# Patient Record
Sex: Female | Born: 1975
Health system: Southern US, Community
[De-identification: ages and names within clinical notes are randomized; demographics above are authoritative.]

## PROBLEM LIST (undated history)

## (undated) DIAGNOSIS — D689 Coagulation defect, unspecified: Secondary | ICD-10-CM

## (undated) DIAGNOSIS — I82409 Acute embolism and thrombosis of unspecified deep veins of unspecified lower extremity: Secondary | ICD-10-CM

## (undated) HISTORY — PX: OTHER SURGICAL HISTORY: SHX169

## (undated) HISTORY — DX: Coagulation defect, unspecified: D68.9

---

## 2011-10-31 ENCOUNTER — Encounter (HOSPITAL_BASED_OUTPATIENT_CLINIC_OR_DEPARTMENT_OTHER): Payer: Self-pay

## 2011-10-31 ENCOUNTER — Emergency Department (HOSPITAL_BASED_OUTPATIENT_CLINIC_OR_DEPARTMENT_OTHER)
Admission: EM | Admit: 2011-10-31 | Discharge: 2011-10-31 | Disposition: A | Discharge status: Home / Self Care | Payer: Self-pay | Attending: Emergency Medicine | Admitting: Emergency Medicine

## 2011-10-31 DIAGNOSIS — Z86718 Personal history of other venous thrombosis and embolism: Secondary | ICD-10-CM | POA: Insufficient documentation

## 2011-10-31 DIAGNOSIS — R42 Dizziness and giddiness: Secondary | ICD-10-CM | POA: Insufficient documentation

## 2011-10-31 HISTORY — DX: Acute embolism and thrombosis of unspecified deep veins of unspecified lower extremity: I82.409

## 2011-10-31 MED ORDER — MECLIZINE HCL 50 MG PO TABS: 25.0000 mg | ORAL_TABLET | Freq: Three times a day (TID) | ORAL | Status: AC | PRN

## 2011-10-31 MED ORDER — MECLIZINE HCL 25 MG PO TABS
25.0000 mg | ORAL_TABLET | Freq: Once | ORAL | Status: AC
Start: 2011-10-31 — End: 2011-10-31
  Administered 2011-10-31: 25 mg via ORAL
  Filled 2011-10-31: qty 1

## 2011-10-31 NOTE — ED Provider Notes (Signed)
History   This chart was scribed for Rolan Bucco, MD by Sofie Rower. The patient was seen in room MH07/MH07 and the patient's care was started at 5:49 PM     CSN: [id number]  Arrival date & time 10/31/11  1724   First MD Initiated Contact with Patient 10/31/11 1743      Chief Complaint  Patient presents with  . Dizziness  . Ear Fullness    (Consider location/radiation/quality/duration/timing/severity/associated sxs/prior treatment) HPI  Angela Shah is a 36 y.o. female who presents to the Emergency Department complaining of moderate, constant dizziness onset today. The pt states "there is a swooshing sound in her ears." The pt informs the EDP that she "got up this morning, jumped out of bed fast, and felt like the whole room was spinning." Still feels dizzy when she sits up or turns her head.  No headache, no numbness or weakness to extremities.  No speech problems.  Has hx of similar symptoms once in past, but it went away on its own.   Modifying factors include standing up, sitting up, and moving head side to side which intensifies the dizziness. Pt has a hx of DVT, was on coumadin, has been off for about one years.  Was attributed to train ride that she took.  No hx of clotting disorders that she knows of.   Pt denies chest pain, fever, congestion, difficulty speaking, swollowing, difficulty urinating, missed periods, recent head injuries.   Pt does not have a PCP.     Past Medical History  Diagnosis Date  . DVT (deep venous thrombosis)      History  Substance Use Topics  . Smoking status: Never Smoker   . Smokeless tobacco: Never Used  . Alcohol Use: No    OB History    Grav Para Term Preterm Abortions TAB SAB Ect Mult Living                  Review of Systems  Constitutional: Negative for fever, chills, diaphoresis and fatigue.  HENT: Negative for congestion, rhinorrhea and sneezing.   Eyes: Negative.   Respiratory: Negative for cough, chest tightness and  shortness of breath.   Cardiovascular: Negative for chest pain and leg swelling.  Gastrointestinal: Negative for nausea, vomiting, abdominal pain, diarrhea and blood in stool.  Genitourinary: Negative for frequency, hematuria, flank pain and difficulty urinating.  Musculoskeletal: Negative for back pain and arthralgias.  Skin: Negative for rash.  Neurological: Positive for dizziness. Negative for speech difficulty, weakness, numbness and headaches.    Allergies  Review of patient's allergies indicates no known allergies.  Home Medications   Current Outpatient Rx  Name Route Sig Dispense Refill  . IBUPROFEN 200 MG PO TABS Oral Take 400 mg by mouth every 6 (six) hours as needed. Patient used this medication for her headache.    Marland Kitchen MECLIZINE HCL 50 MG PO TABS Oral Take 0.5 tablets (25 mg total) by mouth 3 (three) times daily as needed. 30 tablet 0    BP 120/83  Pulse 82  Temp 98.1 F (36.7 C) (Oral)  Resp 16  Ht 5\' 4"  (1.626 m)  Wt 156 lb (70.761 kg)  BMI 26.78 kg/m2  SpO2 100%  LMP 09/30/2011  Physical Exam  Nursing note and vitals reviewed. Constitutional: She is oriented to person, place, and time. She appears well-developed and well-nourished.  HENT:  Head: Normocephalic and atraumatic.  Right Ear: External ear normal.  Left Ear: External ear normal.  Nose: Nose normal.  Mouth/Throat: Oropharynx is clear and moist.  Eyes: Pupils are equal, round, and reactive to light. Right eye exhibits no nystagmus. Left eye exhibits no nystagmus.  Neck: Normal range of motion. Neck supple.  Cardiovascular: Normal rate, regular rhythm and normal heart sounds.   Pulmonary/Chest: Effort normal and breath sounds normal. No respiratory distress. She has no wheezes. She has no rales. She exhibits no tenderness.  Abdominal: Soft. Bowel sounds are normal. There is no tenderness. There is no rebound and no guarding.  Musculoskeletal: Normal range of motion. She exhibits no edema.    Lymphadenopathy:    She has no cervical adenopathy.  Neurological: She is alert and oriented to person, place, and time. She has normal strength. No cranial nerve deficit or sensory deficit. Coordination normal. GCS eye subscore is 4. GCS verbal subscore is 5. GCS motor subscore is 6.       Finger to nose intact.   Skin: Skin is warm and dry. No rash noted.  Psychiatric: She has a normal mood and affect.    ED Course  Procedures (including critical care time)  DIAGNOSTIC STUDIES: Oxygen Saturation is 100% on room air, normal by my interpretation.    COORDINATION OF CARE:  5:56PM- EDP at bedside discusses treatment plan concerning vertigo.     Labs Reviewed - No data to display No results found.   1. Vertigo       MDM  Pt feeling much better after meclizine, ambulates without problem.  No headache or neuro deficits to suggest CVA/ICH.  Will tx for vertigo, encouraged f/u with PMD if symptoms not improving.  Advised to return here for any worsening symtpoms or neuro changes      I personally performed the services described in this documentation, which was scribed in my presence.  The recorded information has been reviewed and considered.    Rolan Bucco, MD 10/31/11 2014

## 2011-10-31 NOTE — ED Notes (Signed)
Pt reports dizziness and a "swooshing" sound in right ear.

## 2011-10-31 NOTE — Discharge Instructions (Signed)
Vertigo Vertigo means you feel like you or your surroundings are moving when they are not. Vertigo can be dangerous if it occurs when you are at work, driving, or performing difficult activities.  CAUSES  Vertigo occurs when there is a conflict of signals sent to your brain from the visual and sensory systems in your body. There are many different causes of vertigo, including:  Infections, especially in the inner ear.   A bad reaction to a drug or misuse of alcohol and medicines.   Withdrawal from drugs or alcohol.   Rapidly changing positions, such as lying down or rolling over in bed.   A migraine headache.   Decreased blood flow to the brain.   Increased pressure in the brain from a head injury, infection, tumor, or bleeding.  SYMPTOMS  You may feel as though the world is spinning around or you are falling to the ground. Because your balance is upset, vertigo can cause nausea and vomiting. You may have involuntary eye movements (nystagmus). DIAGNOSIS  Vertigo is usually diagnosed by physical exam. If the cause of your vertigo is unknown, your caregiver may perform imaging tests, such as an MRI scan (magnetic resonance imaging). TREATMENT  Most cases of vertigo resolve on their own, without treatment. Depending on the cause, your caregiver may prescribe certain medicines. If your vertigo is related to body position issues, your caregiver may recommend movements or procedures to correct the problem. In rare cases, if your vertigo is caused by certain inner ear problems, you may need surgery. HOME CARE INSTRUCTIONS   Follow your caregiver's instructions.   Avoid driving.   Avoid operating heavy machinery.   Avoid performing any tasks that would be dangerous to you or others during a vertigo episode.   Tell your caregiver if you notice that certain medicines seem to be causing your vertigo. Some of the medicines used to treat vertigo episodes can actually make them worse in some  people.  SEEK IMMEDIATE MEDICAL CARE IF:   Your medicines do not relieve your vertigo or are making it worse.   You develop problems with talking, walking, weakness, or using your arms, hands, or legs.   You develop severe headaches.   Your nausea or vomiting continues or gets worse.   You develop visual changes.   A family member notices behavioral changes.   Your condition gets worse.  MAKE SURE YOU:  Understand these instructions.   Will watch your condition.   Will get help right away if you are not doing well or get worse.  Document Released: 02/09/2005 Document Revised: 04/21/2011 Document Reviewed: 11/18/2010 Exit[employer] Patient Information 2012 [employer]. 

## 2013-09-12 ENCOUNTER — Emergency Department (HOSPITAL_BASED_OUTPATIENT_CLINIC_OR_DEPARTMENT_OTHER)
Admission: EM | Admit: 2013-09-12 | Discharge: 2013-09-12 | Disposition: A | Discharge status: Home / Self Care | Payer: Medicaid Other | Attending: Emergency Medicine | Admitting: Emergency Medicine

## 2013-09-12 ENCOUNTER — Encounter (HOSPITAL_BASED_OUTPATIENT_CLINIC_OR_DEPARTMENT_OTHER): Payer: Self-pay | Admitting: Emergency Medicine

## 2013-09-12 DIAGNOSIS — J329 Chronic sinusitis, unspecified: Secondary | ICD-10-CM

## 2013-09-12 DIAGNOSIS — J069 Acute upper respiratory infection, unspecified: Secondary | ICD-10-CM

## 2013-09-12 DIAGNOSIS — Z86718 Personal history of other venous thrombosis and embolism: Secondary | ICD-10-CM | POA: Insufficient documentation

## 2013-09-12 DIAGNOSIS — H9209 Otalgia, unspecified ear: Secondary | ICD-10-CM | POA: Insufficient documentation

## 2013-09-12 LAB — RAPID STREP SCREEN (MED CTR MEBANE ONLY): Streptococcus, Group A Screen (Direct): NEGATIVE

## 2013-09-12 MED ORDER — FLUTICASONE PROPIONATE 50 MCG/ACT NA SUSP: 2.0000 | Freq: Every day | NASAL | Status: DC

## 2013-09-12 NOTE — Discharge Instructions (Signed)
Upper Respiratory Infection, Adult An upper respiratory infection (URI) is also sometimes known as the common cold. The upper respiratory tract includes the nose, sinuses, throat, trachea, and bronchi. Bronchi are the airways leading to the lungs. Most people improve within 1 week, but symptoms can last up to 2 weeks. A residual cough may last even longer.  CAUSES Many different viruses can infect the tissues lining the upper respiratory tract. The tissues become irritated and inflamed and often become very moist. Mucus production is also common. A cold is contagious. You can easily spread the virus to others by oral contact. This includes kissing, sharing a glass, coughing, or sneezing. Touching your mouth or nose and then touching a surface, which is then touched by another person, can also spread the virus. SYMPTOMS  Symptoms typically develop 1 to 3 days after you come in contact with a cold virus. Symptoms vary from person to person. They may include:  Runny nose.  Sneezing.  Nasal congestion.  Sinus irritation.  Sore throat.  Loss of voice (laryngitis).  Cough.  Fatigue.  Muscle aches.  Loss of appetite.  Headache.  Low-grade fever. DIAGNOSIS  You might diagnose your own cold based on familiar symptoms, since most people get a cold 2 to 3 times a year. Your caregiver can confirm this based on your exam. Most importantly, your caregiver can check that your symptoms are not due to another disease such as strep throat, sinusitis, pneumonia, asthma, or epiglottitis. Blood tests, throat tests, and X-rays are not necessary to diagnose a common cold, but they may sometimes be helpful in excluding other more serious diseases. Your caregiver will decide if any further tests are required. RISKS AND COMPLICATIONS  You may be at risk for a more severe case of the common cold if you smoke cigarettes, have chronic heart disease (such as heart failure) or lung disease (such as asthma), or if  you have a weakened immune system. The very young and very old are also at risk for more serious infections. Bacterial sinusitis, middle ear infections, and bacterial pneumonia can complicate the common cold. The common cold can worsen asthma and chronic obstructive pulmonary disease (COPD). Sometimes, these complications can require emergency medical care and may be life-threatening. PREVENTION  The best way to protect against getting a cold is to practice good hygiene. Avoid oral or hand contact with people with cold symptoms. Wash your hands often if contact occurs. There is no clear evidence that vitamin C, vitamin E, echinacea, or exercise reduces the chance of developing a cold. However, it is always recommended to get plenty of rest and practice good nutrition. TREATMENT  Treatment is directed at relieving symptoms. There is no cure. Antibiotics are not effective, because the infection is caused by a virus, not by bacteria. Treatment may include:  Increased fluid intake. Sports drinks offer valuable electrolytes, sugars, and fluids.  Breathing heated mist or steam (vaporizer or shower).  Eating chicken soup or other clear broths, and maintaining good nutrition.  Getting plenty of rest.  Using gargles or lozenges for comfort.  Controlling fevers with ibuprofen or acetaminophen as directed by your caregiver.  Increasing usage of your inhaler if you have asthma. Zinc gel and zinc lozenges, taken in the first 24 hours of the common cold, can shorten the duration and lessen the severity of symptoms. Pain medicines may help with fever, muscle aches, and throat pain. A variety of non-prescription medicines are available to treat congestion and runny nose. Your caregiver  duration and lessen the severity of symptoms. Pain medicines may help with fever, muscle aches, and throat pain. A variety of non-prescription medicines are available to treat congestion and runny nose. Your caregiver can make recommendations and may suggest nasal or lung inhalers for other symptoms.   HOME CARE INSTRUCTIONS   · Only take over-the-counter or prescription medicines for pain, discomfort, or fever as directed by your  caregiver.  · Use a warm mist humidifier or inhale steam from a shower to increase air moisture. This may keep secretions moist and make it easier to breathe.  · Drink enough water and fluids to keep your urine clear or pale yellow.  · Rest as needed.  · Return to work when your temperature has returned to normal or as your caregiver advises. You may need to stay home longer to avoid infecting others. You can also use a face mask and careful hand washing to prevent spread of the virus.  SEEK MEDICAL CARE IF:   · After the first few days, you feel you are getting worse rather than better.  · You need your caregiver's advice about medicines to control symptoms.  · You develop chills, worsening shortness of breath, or brown or red sputum. These may be signs of pneumonia.  · You develop yellow or brown nasal discharge or pain in the face, especially when you bend forward. These may be signs of sinusitis.  · You develop a fever, swollen neck glands, pain with swallowing, or white areas in the back of your throat. These may be signs of strep throat.  SEEK IMMEDIATE MEDICAL CARE IF:   · You have a fever.  · You develop severe or persistent headache, ear pain, sinus pain, or chest pain.  · You develop wheezing, a prolonged cough, cough up blood, or have a change in your usual mucus (if you have chronic lung disease).  · You develop sore muscles or a stiff neck.  Document Released: 10/26/2000 Document Revised: 07/25/2011 Document Reviewed: 09/03/2010  [employer]® Patient Information ©2014 [employer].  Sinusitis  Sinusitis is redness, soreness, and swelling (inflammation) of the paranasal sinuses. Paranasal sinuses are air pockets within the bones of your face (beneath the eyes, the middle of the forehead, or above the eyes). In healthy paranasal sinuses, mucus is able to drain out, and air is able to circulate through them by way of your nose. However, when your paranasal sinuses are inflamed, mucus and air can become  trapped. This can allow bacteria and other germs to grow and cause infection.  Sinusitis can develop quickly and last only a short time (acute) or continue over a long period (chronic). Sinusitis that lasts for more than 12 weeks is considered chronic.   CAUSES   Causes of sinusitis include:  · Allergies.  · Structural abnormalities, such as displacement of the cartilage that separates your nostrils (deviated septum), which can decrease the air flow through your nose and sinuses and affect sinus drainage.  · Functional abnormalities, such as when the small hairs (cilia) that line your sinuses and help remove mucus do not work properly or are not present.  SYMPTOMS   Symptoms of acute and chronic sinusitis are the same. The primary symptoms are pain and pressure around the affected sinuses. Other symptoms include:  · Upper toothache.  · Earache.  · Headache.  · Bad breath.  · Decreased sense of smell and taste.  · A cough, which worsens when you are lying flat.  · Fatigue.  ·   Fever.  · Thick drainage from your nose, which often is green and may contain pus (purulent).  · Swelling and warmth over the affected sinuses.  DIAGNOSIS   Your caregiver will perform a physical exam. During the exam, your caregiver may:  · Look in your nose for signs of abnormal growths in your nostrils (nasal polyps).  · Tap over the affected sinus to check for signs of infection.  · View the inside of your sinuses (endoscopy) with a special imaging device with a light attached (endoscope), which is inserted into your sinuses.  If your caregiver suspects that you have chronic sinusitis, one or more of the following tests may be recommended:  · Allergy tests.  · Nasal culture A sample of mucus is taken from your nose and sent to a lab and screened for bacteria.  · Nasal cytology A sample of mucus is taken from your nose and examined by your caregiver to determine if your sinusitis is related to an allergy.  TREATMENT   Most cases of acute  sinusitis are related to a viral infection and will resolve on their own within 10 days. Sometimes medicines are prescribed to help relieve symptoms (pain medicine, decongestants, nasal steroid sprays, or saline sprays).   However, for sinusitis related to a bacterial infection, your caregiver will prescribe antibiotic medicines. These are medicines that will help kill the bacteria causing the infection.   Rarely, sinusitis is caused by a fungal infection. In theses cases, your caregiver will prescribe antifungal medicine.  For some cases of chronic sinusitis, surgery is needed. Generally, these are cases in which sinusitis recurs more than 3 times per year, despite other treatments.  HOME CARE INSTRUCTIONS   · Drink plenty of water. Water helps thin the mucus so your sinuses can drain more easily.  · Use a humidifier.  · Inhale steam 3 to 4 times a day (for example, sit in the bathroom with the shower running).  · Apply a warm, moist washcloth to your face 3 to 4 times a day, or as directed by your caregiver.  · Use saline nasal sprays to help moisten and clean your sinuses.  · Take over-the-counter or prescription medicines for pain, discomfort, or fever only as directed by your caregiver.  SEEK IMMEDIATE MEDICAL CARE IF:  · You have increasing pain or severe headaches.  · You have nausea, vomiting, or drowsiness.  · You have swelling around your face.  · You have vision problems.  · You have a stiff neck.  · You have difficulty breathing.  MAKE SURE YOU:   · Understand these instructions.  · Will watch your condition.  · Will get help right away if you are not doing well or get worse.  Document Released: 05/02/2005 Document Revised: 07/25/2011 Document Reviewed: 05/17/2011  [employer]® Patient Information ©2014 [employer].

## 2013-09-12 NOTE — ED Provider Notes (Signed)
TIME SEEN: 12:15 PM  CHIEF COMPLAINT: Cough, sore throat, ear pain  HPI: Patient is a 38 y.o. F with history of prior DVT who presents emergency department with one day of bilateral ear pain, sore throat, productive cough with yellow secretion, rhinorrhea and nasal congestion. She states she's also had low-grade fever. No chest pain or shortness of breath. No vomiting or diarrhea. No headache, neck pain or neck stiffness. No sick contacts or recent travel.  ROS: See HPI Constitutional: Subjective fever  Eyes: no drainage  ENT: runny nose   Cardiovascular:  no chest pain  Resp: no SOB  GI: no vomiting GU: no dysuria Integumentary: no rash  Allergy: no hives  Musculoskeletal: no leg swelling  Neurological: no slurred speech ROS otherwise negative  PAST MEDICAL HISTORY/PAST SURGICAL HISTORY:  Past Medical History  Diagnosis Date  . DVT (deep venous thrombosis)     MEDICATIONS:  Prior to Admission medications   Medication Sig Start Date End Date Taking? Authorizing Provider  ibuprofen (ADVIL,MOTRIN) 200 MG tablet Take 400 mg by mouth every 6 (six) hours as needed. Patient used this medication for her headache.    Historical Provider, MD    ALLERGIES:  No Known Allergies  SOCIAL HISTORY:  History  Substance Use Topics  . Smoking status: Never Smoker   . Smokeless tobacco: Never Used  . Alcohol Use: No    FAMILY HISTORY: No family history on file.  EXAM: BP 132/81  Pulse 82  Temp(Src) 99.5 F (37.5 C) (Oral)  Resp 16  Ht 5\' 4"  (1.626 m)  Wt 148 lb (67.132 kg)  BMI 25.39 kg/m2  SpO2 100%  LMP 09/05/2013 CONSTITUTIONAL: Alert and oriented and responds appropriately to questions. Well-appearing; well-nourished HEAD: Normocephalic EYES: Conjunctivae clear, PERRL ENT: normal nose; no rhinorrhea; moist mucous membranes; patient has clear to yellow sinus drainage is in her posterior oropharynx, no tonsillar hypertrophy or exudate, no uvular deviation, no trismus or  drooling, no muffled voice, TMs are clear bilaterally, mild tenderness to palpation over her maxillary sinus bilaterally NECK: Supple, no meningismus, no LAD  CARD: RRR; S1 and S2 appreciated; no murmurs, no clicks, no rubs, no gallops RESP: Normal chest excursion without splinting or tachypnea; breath sounds clear and equal bilaterally; no wheezes, no rhonchi, no rales,  ABD/GI: Normal bowel sounds; non-distended; soft, non-tender, no rebound, no guarding BACK:  The back appears normal and is non-tender to palpation, there is no CVA tenderness EXT: Normal ROM in all joints; non-tender to palpation; no edema; normal capillary refill; no cyanosis    SKIN: Normal color for age and race; warm NEURO: Moves all extremities equally PSYCH: The patient's mood and manner are appropriate. Grooming and personal hygiene are appropriate.  MEDICAL DECISION MAKING: Patient here with likely viral symptoms. I do not feel she needs antibiotics at this time. Her strep test is negative. Her lungs are clear and she is not hypoxic and is only complaining of a minimal amount of cough. I do not think she has community-acquired pneumonia. We'll discharge him with a prescription for Flonase and work note. Have discussed return precautions and supportive care instructions. Patient verbalizes understanding and is comfortable with this plan.       Blanch[place], DO 09/12/13 1240

## 2013-09-12 NOTE — ED Notes (Signed)
Patient states she developed a bilateral ear ache last night with a sore throat and a productive cough with yellow secretions.

## 2013-09-14 LAB — CULTURE, GROUP A STREP

## 2014-01-11 ENCOUNTER — Encounter (HOSPITAL_BASED_OUTPATIENT_CLINIC_OR_DEPARTMENT_OTHER): Payer: Self-pay | Admitting: Emergency Medicine

## 2014-01-11 ENCOUNTER — Emergency Department (HOSPITAL_BASED_OUTPATIENT_CLINIC_OR_DEPARTMENT_OTHER): Payer: Medicaid Other

## 2014-01-11 ENCOUNTER — Emergency Department (HOSPITAL_BASED_OUTPATIENT_CLINIC_OR_DEPARTMENT_OTHER)
Admission: EM | Admit: 2014-01-11 | Discharge: 2014-01-11 | Disposition: A | Discharge status: Home / Self Care | Payer: Medicaid Other | Attending: Emergency Medicine | Admitting: Emergency Medicine

## 2014-01-11 DIAGNOSIS — IMO0002 Reserved for concepts with insufficient information to code with codable children: Secondary | ICD-10-CM | POA: Diagnosis not present

## 2014-01-11 DIAGNOSIS — R079 Chest pain, unspecified: Secondary | ICD-10-CM | POA: Diagnosis present

## 2014-01-11 DIAGNOSIS — R071 Chest pain on breathing: Secondary | ICD-10-CM | POA: Insufficient documentation

## 2014-01-11 DIAGNOSIS — R0789 Other chest pain: Secondary | ICD-10-CM

## 2014-01-11 DIAGNOSIS — Z86718 Personal history of other venous thrombosis and embolism: Secondary | ICD-10-CM | POA: Insufficient documentation

## 2014-01-11 MED ORDER — IBUPROFEN 800 MG PO TABS: 800.0000 mg | ORAL_TABLET | Freq: Three times a day (TID) | ORAL | Status: DC

## 2014-01-11 MED ORDER — TRAMADOL HCL 50 MG PO TABS: 50.0000 mg | ORAL_TABLET | Freq: Four times a day (QID) | ORAL | Status: DC | PRN

## 2014-01-11 NOTE — ED Notes (Addendum)
Pt states woke up this am with right side pain.  Pain worse with movement and especially extension of right side muscles (raising arm over head, standing up).  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

## 2014-01-11 NOTE — ED Provider Notes (Signed)
CSN: [id number]     Arrival date & time 01/11/14  1316 History  This chart was scribed for Angela Shah, * by Randa Evens, ED Scribe. This patient was seen in room MH04/MH04 and the patient's care was started at 3:22 PM.     Chief Complaint  Patient presents with  . rib pain    The history is provided by the patient. No language interpreter was used.   HPI Comments: Angela Shah is a 38 y.o. female who presents to the Emergency Department complaining of right sided rib pain onset today. She states that the pain worsens with movement and deep breathing. She states that moving twisting, bending and moving her arm makes her pain worse also. She denies any injury to the area. She denies rash or abdominal pain.   Past Medical History  Diagnosis Date  . DVT (deep venous thrombosis)    Past Surgical History  Procedure Laterality Date  . Tubal ligation     No family history on file. History  Substance Use Topics  . Smoking status: Never Smoker   . Smokeless tobacco: Never Used  . Alcohol Use: No   OB History   Grav Para Term Preterm Abortions TAB SAB Ect Mult Living                 Review of Systems  Gastrointestinal: Negative for abdominal pain.  Musculoskeletal: Positive for arthralgias.  Skin: Negative for rash.  All other systems reviewed and are negative.   Allergies  Review of patient's allergies indicates no known allergies.  Home Medications   Prior to Admission medications   Medication Sig Start Date End Date Taking? Authorizing Provider  fluticasone (FLONASE) 50 MCG/ACT nasal spray Place 2 sprays into both nostrils daily. 09/12/13   Kristen N Ward, DO   Triage Vitals: BP 117/70  Pulse 74  Temp(Src) 98.1 F (36.7 C) (Oral)  Resp 18  Wt 148 lb (67.132 kg)  SpO2 100%  LMP 01/09/2014  Physical Exam  Constitutional: She is oriented to person, place, and time. She appears well-developed and well-nourished. No distress.  HENT:  Head:  Normocephalic and atraumatic.  Right Ear: Hearing normal.  Left Ear: Hearing normal.  Nose: Nose normal.  Mouth/Throat: Oropharynx is clear and moist and mucous membranes are normal.  Eyes: Conjunctivae and EOM are normal. Pupils are equal, round, and reactive to light.  Neck: Normal range of motion. Neck supple.  Cardiovascular: Regular rhythm, S1 normal and S2 normal.  Exam reveals no gallop and no friction rub.   No murmur heard. Pulmonary/Chest: Effort normal and breath sounds normal. No respiratory distress. She exhibits no tenderness.  Abdominal: Soft. Normal appearance and bowel sounds are normal. There is no hepatosplenomegaly. There is no tenderness. There is no rebound, no guarding, no tenderness at McBurney's point and negative Murphy's sign. No hernia.  Musculoskeletal: Normal range of motion. She exhibits tenderness.  Tenderness around right lateral ribs no crepitus   Neurological: She is alert and oriented to person, place, and time. She has normal strength. No cranial nerve deficit or sensory deficit. Coordination normal. GCS eye subscore is 4. GCS verbal subscore is 5. GCS motor subscore is 6.  Skin: Skin is warm, dry and intact. No rash noted. No cyanosis.  Psychiatric: She has a normal mood and affect. Her speech is normal and behavior is normal. Thought content normal.    ED Course  Procedures (including critical care time) DIAGNOSTIC STUDIES: Oxygen Saturation is 100% on RA,  normal by my interpretation.    COORDINATION OF CARE: 3:26 PM-Discussed treatment plan which includes X-ray of Ribs on right side with pt at bedside and pt agreed to plan.     Labs Review Labs Reviewed - No data to display  Imaging Review No results found.   EKG Interpretation None      MDM   Final diagnoses:  None   Presents to ER for evaluation of right posterior and lateral rib discomfort since waking this morning. Area is tender to touch without crepitance. No overlying skin  changes, no cellulitis or vesicular rash. Patient's lungs are clear. Chest x-ray with ribs this show any acute abnormality. Patient is breathing comfortably. There is no shortness of breath. Vital signs are normal. Wells criteria/PERC negative for PE. Patient has some very slight reproducibility of her pain with range of motion of her chest wall. This is consistent with musculoskeletal chest pain.    I personally performed the services described in this documentation, which was scribed in my presence. The recorded information has been reviewed and is accurate.       Angela Greek, MD 01/11/14 [phone]

## 2014-01-11 NOTE — ED Notes (Signed)
Patient here with right sided thoracic back and rib pain after awakening this am. Denies injury

## 2014-01-11 NOTE — Discharge Instructions (Signed)

## 2014-06-12 ENCOUNTER — Emergency Department (HOSPITAL_BASED_OUTPATIENT_CLINIC_OR_DEPARTMENT_OTHER)
Admission: EM | Admit: 2014-06-12 | Discharge: 2014-06-12 | Disposition: A | Discharge status: Home / Self Care | Payer: BLUE CROSS/BLUE SHIELD | Attending: Emergency Medicine | Admitting: Emergency Medicine

## 2014-06-12 ENCOUNTER — Encounter (HOSPITAL_BASED_OUTPATIENT_CLINIC_OR_DEPARTMENT_OTHER): Payer: Self-pay

## 2014-06-12 DIAGNOSIS — G43909 Migraine, unspecified, not intractable, without status migrainosus: Secondary | ICD-10-CM | POA: Diagnosis not present

## 2014-06-12 DIAGNOSIS — R51 Headache: Secondary | ICD-10-CM | POA: Diagnosis present

## 2014-06-12 DIAGNOSIS — G43009 Migraine without aura, not intractable, without status migrainosus: Secondary | ICD-10-CM

## 2014-06-12 DIAGNOSIS — Z86718 Personal history of other venous thrombosis and embolism: Secondary | ICD-10-CM | POA: Insufficient documentation

## 2014-06-12 DIAGNOSIS — Z7952 Long term (current) use of systemic steroids: Secondary | ICD-10-CM | POA: Diagnosis not present

## 2014-06-12 MED ORDER — BUTALBITAL-APAP-CAFFEINE 50-325-40 MG PO TABS: 1.0000 | ORAL_TABLET | Freq: Four times a day (QID) | ORAL | Status: DC | PRN

## 2014-06-12 MED ORDER — SODIUM CHLORIDE 0.9 % IV BOLUS (SEPSIS)
1000.0000 mL | Freq: Once | INTRAVENOUS | Status: AC
  Administered 2014-06-12: 1000 mL via INTRAVENOUS

## 2014-06-12 MED ORDER — METOCLOPRAMIDE HCL 5 MG/ML IJ SOLN
10.0000 mg | Freq: Once | INTRAMUSCULAR | Status: AC
  Administered 2014-06-12: 10 mg via INTRAVENOUS
  Filled 2014-06-12: qty 2

## 2014-06-12 MED ORDER — DIPHENHYDRAMINE HCL 50 MG/ML IJ SOLN
25.0000 mg | Freq: Once | INTRAMUSCULAR | Status: AC
  Administered 2014-06-12: 25 mg via INTRAVENOUS
  Filled 2014-06-12: qty 1

## 2014-06-12 MED ORDER — DEXAMETHASONE SODIUM PHOSPHATE 10 MG/ML IJ SOLN
10.0000 mg | Freq: Once | INTRAMUSCULAR | Status: AC
  Administered 2014-06-12: 10 mg via INTRAVENOUS
  Filled 2014-06-12: qty 1

## 2014-06-12 MED ORDER — KETOROLAC TROMETHAMINE 30 MG/ML IJ SOLN
30.0000 mg | Freq: Once | INTRAMUSCULAR | Status: AC
  Administered 2014-06-12: 30 mg via INTRAVENOUS
  Filled 2014-06-12: qty 1

## 2014-06-12 MED ORDER — PROMETHAZINE HCL 25 MG PO TABS: 25.0000 mg | ORAL_TABLET | Freq: Four times a day (QID) | ORAL | Status: DC | PRN

## 2014-06-12 NOTE — ED Notes (Signed)
MD at bedside. 

## 2014-06-12 NOTE — ED Provider Notes (Signed)
TIME SEEN: 9:15 AM  CHIEF COMPLAINT: Headache, nausea  HPI: Pt is a 39 y.o. female with prior history of DVT no longer on anticoagulation who presents to the emergency department with right-sided throbbing headache for the past 2 days. Patient reports her headache is worse with lights and sounds. She does have associated nausea. No fever but did have chills last night. No neck pain or neck stiffness. No headache. Has had a similar headache many years ago that she had to go to the hospital for further treatment. Denies that she's ever been told she has migraines. Tried ibuprofen at home without relief. Has had tingling in bilateral hands but no other numbness, focal weakness.  ROS: See HPI Constitutional: no fever  Eyes: no drainage  ENT: no runny nose   Cardiovascular:  no chest pain  Resp: no SOB  GI: no vomiting GU: no dysuria Integumentary: no rash  Allergy: no hives  Musculoskeletal: no leg swelling  Neurological: no slurred speech ROS otherwise negative  PAST MEDICAL HISTORY/PAST SURGICAL HISTORY:  Past Medical History  Diagnosis Date  . DVT (deep venous thrombosis)     MEDICATIONS:  Prior to Admission medications   Medication Sig Start Date End Date Taking? Authorizing Provider  fluticasone (FLONASE) 50 MCG/ACT nasal spray Place 2 sprays into both nostrils daily. 09/12/13   Kristen N Ward, DO  ibuprofen (ADVIL,MOTRIN) 800 MG tablet Take 1 tablet (800 mg total) by mouth 3 (three) times daily. 01/11/14   [name], MD  traMADol (ULTRAM) 50 MG tablet Take 1 tablet (50 mg total) by mouth every 6 (six) hours as needed. 01/11/14   [name], MD    ALLERGIES:  No Known Allergies  SOCIAL HISTORY:  History  Substance Use Topics  . Smoking status: Never Smoker   . Smokeless tobacco: Never Used  . Alcohol Use: No    FAMILY HISTORY: No family history on file.  EXAM: BP 115/90 mmHg  Pulse 88  Temp(Src) 98.3 F (36.8 C) (Oral)  Resp 16  Ht 5\' 4"   (1.626 m)  Wt 147 lb (66.679 kg)  BMI 25.22 kg/m2  SpO2 100%  LMP 06/12/2014 CONSTITUTIONAL: Alert and oriented and responds appropriately to questions. Well-appearing; well-nourished HEAD: Normocephalic EYES: Conjunctivae clear, PERRL, photophobia ENT: normal nose; no rhinorrhea; moist mucous membranes; pharynx without lesions noted NECK: Supple, no meningismus, no LAD  CARD: RRR; S1 and S2 appreciated; no murmurs, no clicks, no rubs, no gallops RESP: Normal chest excursion without splinting or tachypnea; breath sounds clear and equal bilaterally; no wheezes, no rhonchi, no rales,  ABD/GI: Normal bowel sounds; non-distended; soft, non-tender, no rebound, no guarding BACK:  The back appears normal and is non-tender to palpation, there is no CVA tenderness EXT: Normal ROM in all joints; non-tender to palpation; no edema; normal capillary refill; no cyanosis    SKIN: Normal color for age and race; warm NEURO: Moves all extremities equally, sensation to light touch intact diffusely, cranial nerves II through XII intact, strength 5/5 in all 4 extremities PSYCH: The patient's mood and manner are appropriate. Grooming and personal hygiene are appropriate.  MEDICAL DECISION MAKING: Patient here with likely migraine headache. She has no meningismus and is neurologically intact, afebrile. I do not feel she needs head imaging at this time. Does not describe this as the worst of her life or with sudden onset. Doubt subarachnoid hemorrhage. Will treat with Toradol, Reglan, IV fluids, Benadryl and Decadron.  ED PROGRESS: Patient reports her headache is almost  completely gone after migraine cocktail. We'll discharge with prescriptions for Fioricet and Phenergan to use in the future. Discussed return precautions. She verbalized understanding and is comfortable with plan.     [place], DO 06/12/14 1037

## 2014-06-12 NOTE — ED Notes (Signed)
Pt reports headache x 2 days. Reports photosensitivity and nausea. Denies hx of HA and congestion.

## 2014-06-12 NOTE — Discharge Instructions (Signed)

## 2014-11-12 ENCOUNTER — Emergency Department (HOSPITAL_BASED_OUTPATIENT_CLINIC_OR_DEPARTMENT_OTHER)
Admission: EM | Admit: 2014-11-12 | Discharge: 2014-11-12 | Disposition: A | Discharge status: Home / Self Care | Payer: BLUE CROSS/BLUE SHIELD | Attending: Emergency Medicine | Admitting: Emergency Medicine

## 2014-11-12 ENCOUNTER — Encounter (HOSPITAL_BASED_OUTPATIENT_CLINIC_OR_DEPARTMENT_OTHER): Payer: Self-pay

## 2014-11-12 ENCOUNTER — Emergency Department (HOSPITAL_BASED_OUTPATIENT_CLINIC_OR_DEPARTMENT_OTHER): Payer: BLUE CROSS/BLUE SHIELD

## 2014-11-12 DIAGNOSIS — M79672 Pain in left foot: Secondary | ICD-10-CM | POA: Insufficient documentation

## 2014-11-12 DIAGNOSIS — Z86718 Personal history of other venous thrombosis and embolism: Secondary | ICD-10-CM | POA: Insufficient documentation

## 2014-11-12 DIAGNOSIS — M7989 Other specified soft tissue disorders: Secondary | ICD-10-CM

## 2014-11-12 DIAGNOSIS — R609 Edema, unspecified: Secondary | ICD-10-CM

## 2014-11-12 DIAGNOSIS — R2242 Localized swelling, mass and lump, left lower limb: Secondary | ICD-10-CM | POA: Insufficient documentation

## 2014-11-12 NOTE — Discharge Instructions (Signed)
Plantar Fasciitis  Plantar fasciitis is a common condition that causes foot pain. It is soreness (inflammation) of the band of tough fibrous tissue on the bottom of the foot that runs from the heel bone (calcaneus) to the ball of the foot. The cause of this soreness may be from excessive standing, poor fitting shoes, running on hard surfaces, being overweight, having an abnormal walk, or overuse (this is common in runners) of the painful foot or feet. It is also common in aerobic exercise dancers and ballet dancers.  SYMPTOMS   Most people with plantar fasciitis complain of:   Severe pain in the morning on the bottom of their foot especially when taking the first steps out of bed. This pain recedes after a few minutes of walking.   Severe pain is experienced also during walking following a long period of inactivity.   Pain is worse when walking barefoot or up stairs  DIAGNOSIS    Your caregiver will diagnose this condition by examining and feeling your foot.   Special tests such as X-rays of your foot, are usually not needed.  PREVENTION    Consult a sports medicine professional before beginning a new exercise program.   Walking programs offer a good workout. With walking there is a lower chance of overuse injuries common to runners. There is less impact and less jarring of the joints.   Begin all new exercise programs slowly. If problems or pain develop, decrease the amount of time or distance until you are at a comfortable level.   Wear good shoes and replace them regularly.   Stretch your foot and the heel cords at the back of the ankle (Achilles tendon) both before and after exercise.   Run or exercise on even surfaces that are not hard. For example, asphalt is better than pavement.   Do not run barefoot on hard surfaces.   If using a treadmill, vary the incline.   Do not continue to workout if you have foot or joint problems. Seek professional help if they do not improve.  HOME CARE INSTRUCTIONS     Avoid activities that cause you pain until you recover.   Use ice or cold packs on the problem or painful areas after working out.   Only take over-the-counter or prescription medicines for pain, discomfort, or fever as directed by your caregiver.   Soft shoe inserts or athletic shoes with air or gel sole cushions may be helpful.   If problems continue or become more severe, consult a sports medicine caregiver or your own health care provider. Cortisone is a potent anti-inflammatory medication that may be injected into the painful area. You can discuss this treatment with your caregiver.  MAKE SURE YOU:    Understand these instructions.   Will watch your condition.   Will get help right away if you are not doing well or get worse.  Document Released: 01/25/2001 Document Revised: 07/25/2011 Document Reviewed: 03/26/2008  [employer] Patient Information 2015 [employer]. This information is not intended to replace advice given to you by your health care provider. Make sure you discuss any questions you have with your health care provider.

## 2014-11-12 NOTE — ED Notes (Signed)
C/o left leg swelling since Sunday-denies injury-hx DVT to LLE

## 2014-11-12 NOTE — ED Provider Notes (Signed)
CSN: [id number]     Arrival date & time 11/12/14  1518 History   First MD Initiated Contact with Patient 11/12/14 1525     Chief Complaint  Patient presents with  . Leg Swelling     (Consider location/radiation/quality/duration/timing/severity/associated sxs/prior Treatment) The history is provided by the patient.   Angela Shah is a 39 yo F PMH DVT (5 yrs ago) with c/o left leg swelling. Started on Sunday. Swelling started in her left foot and now radiates proximally to her mid tibia.  She is also having pain in her arch. This presentation is similar to her previous episode of DVT.  She denies any injury or trauma to her foot. No chest pain or SOB. Pain in her left foot is worse with the first few steps and doesn't relieve through the day. She has been wearing different shoes recently.   Past Medical History  Diagnosis Date  . DVT (deep venous thrombosis)    Past Surgical History  Procedure Laterality Date  . Tubal ligation     No family history on file. History  Substance Use Topics  . Smoking status: Never Smoker   . Smokeless tobacco: Never Used  . Alcohol Use: No   OB History    No data available     Review of Systems  Constitutional: Negative for fever and chills.  Respiratory: Negative for shortness of breath.   Cardiovascular: Negative for chest pain.  Gastrointestinal: Negative for nausea, vomiting, diarrhea and constipation.  Skin: Negative for rash.      Allergies  Review of patient's allergies indicates no known allergies.  Home Medications   Prior to Admission medications   Not on File   BP 120/73 mmHg  Pulse 73  Temp(Src) 98.3 F (36.8 C) (Oral)  Resp 18  Ht 5\' 5"  (1.651 m)  Wt 152 lb (68.947 kg)  BMI 25.29 kg/m2  SpO2 100%  LMP 11/04/2014 Physical Exam  Constitutional: She is oriented to person, place, and time. She appears well-developed and well-nourished.  HENT:  Head: Normocephalic and atraumatic.  Eyes: Conjunctivae and EOM are  normal.  Neck: Normal range of motion.  Cardiovascular: Normal rate, regular rhythm, normal heart sounds and intact distal pulses.   No murmur heard. Pulmonary/Chest: Effort normal and breath sounds normal.  Abdominal: Soft. Bowel sounds are normal.  Musculoskeletal:  Calf are symmetrical in size  Able to stand on tip toes and heels  Normal gait  Left foot: no erythema or ecchymosis  TTP along the longitudinal arch  No metatarsal pain, distal end of fibula or tibia. No navicular or base of fifth tarsal tenderness FROM  Normal strength  neurovascularly intact  Neg anterior drawer  Neg talar tilt  Neg squeeze test     Neurological: She is alert and oriented to person, place, and time.  Skin: Skin is warm. No rash noted.    ED Course  Procedures (including critical care time) Labs Review Labs Reviewed - No data to display  Imaging Review US Venous Img Lower Bilateral  11/12/2014   CLINICAL DATA:  39 year old female with left foot pain for the past 3 days and prior history of DVT 5 years ago.  EXAM: BILATERAL LOWER EXTREMITY VENOUS DOPPLER ULTRASOUND  TECHNIQUE: Gray-scale sonography with graded compression, as well as color Doppler and duplex ultrasound were performed to evaluate the lower extremity deep venous systems from the level of the common femoral vein and including the common femoral, femoral, profunda femoral, popliteal and calf veins including  the posterior tibial, peroneal and gastrocnemius veins when visible. The superficial great saphenous vein was also interrogated. Spectral Doppler was utilized to evaluate flow at rest and with distal augmentation maneuvers in the common femoral, femoral and popliteal veins.  COMPARISON:  None.  FINDINGS: Contralateral Common Femoral Vein: Respiratory phasicity is normal and symmetric with the symptomatic side. No evidence of thrombus. Normal compressibility.  Common Femoral Vein: No evidence of thrombus. Normal compressibility,  respiratory phasicity and response to augmentation.  Saphenofemoral Junction: No evidence of thrombus. Normal compressibility and flow on color Doppler imaging.  Profunda Femoral Vein: No evidence of thrombus. Normal compressibility and flow on color Doppler imaging.  Femoral Vein: No evidence of thrombus. Normal compressibility, respiratory phasicity and response to augmentation.  Popliteal Vein: No evidence of thrombus. Normal compressibility, respiratory phasicity and response to augmentation.  Calf Veins: No evidence of thrombus. Normal compressibility and flow on color Doppler imaging.  Superficial Great Saphenous Vein: No evidence of thrombus. Normal compressibility and flow on color Doppler imaging.  Venous Reflux:  None.  Other Findings:  None.  IMPRESSION: No evidence of deep venous thrombosis.   Electronically Signed   By: Jacqulynn Cadet M.D.   On: 11/12/2014 16:26     EKG Interpretation None      MDM   Final diagnoses:  Leg swelling  Left foot pain   Angela Shah is p/w left foot swelling with point tenderness along the arch of her left foot. Venous duplex shows no DVT. Most likely her pain is 2/2 to plantar fasciitis or a foot strain. Given home modalities. Provided a work note.   Rosemarie Ax, MD PGY-2, [facility] 11/12/2014, 5:06 PM      Rosemarie Ax, MD 11/12/14 1610  Serita Grit, MD 11/12/14 [id number]

## 2016-05-26 ENCOUNTER — Emergency Department (HOSPITAL_BASED_OUTPATIENT_CLINIC_OR_DEPARTMENT_OTHER): Payer: 59

## 2016-05-26 ENCOUNTER — Emergency Department (HOSPITAL_BASED_OUTPATIENT_CLINIC_OR_DEPARTMENT_OTHER)
Admission: EM | Admit: 2016-05-26 | Discharge: 2016-05-26 | Disposition: A | Discharge status: Home / Self Care | Payer: 59 | Attending: Emergency Medicine | Admitting: Emergency Medicine

## 2016-05-26 ENCOUNTER — Encounter (HOSPITAL_BASED_OUTPATIENT_CLINIC_OR_DEPARTMENT_OTHER): Payer: Self-pay | Admitting: *Deleted

## 2016-05-26 DIAGNOSIS — S161XXA Strain of muscle, fascia and tendon at neck level, initial encounter: Secondary | ICD-10-CM | POA: Diagnosis not present

## 2016-05-26 DIAGNOSIS — R079 Chest pain, unspecified: Secondary | ICD-10-CM | POA: Diagnosis not present

## 2016-05-26 DIAGNOSIS — Y9389 Activity, other specified: Secondary | ICD-10-CM | POA: Insufficient documentation

## 2016-05-26 DIAGNOSIS — Y999 Unspecified external cause status: Secondary | ICD-10-CM | POA: Insufficient documentation

## 2016-05-26 DIAGNOSIS — R519 Headache, unspecified: Secondary | ICD-10-CM

## 2016-05-26 DIAGNOSIS — S199XXA Unspecified injury of neck, initial encounter: Secondary | ICD-10-CM | POA: Diagnosis present

## 2016-05-26 DIAGNOSIS — M25512 Pain in left shoulder: Secondary | ICD-10-CM

## 2016-05-26 DIAGNOSIS — R51 Headache: Secondary | ICD-10-CM | POA: Diagnosis not present

## 2016-05-26 DIAGNOSIS — Y92410 Unspecified street and highway as the place of occurrence of the external cause: Secondary | ICD-10-CM | POA: Diagnosis not present

## 2016-05-26 DIAGNOSIS — M546 Pain in thoracic spine: Secondary | ICD-10-CM | POA: Diagnosis not present

## 2016-05-26 MED ORDER — METHOCARBAMOL 500 MG PO TABS: 500.0000 mg | ORAL_TABLET | Freq: Two times a day (BID) | ORAL | 0 refills | Status: DC

## 2016-05-26 MED ORDER — NAPROXEN 375 MG PO TABS: 375.0000 mg | ORAL_TABLET | Freq: Two times a day (BID) | ORAL | 0 refills | Status: DC

## 2016-05-26 MED ORDER — IBUPROFEN 200 MG PO TABS
600.0000 mg | ORAL_TABLET | Freq: Once | ORAL | Status: AC
  Administered 2016-05-26: 600 mg via ORAL
  Filled 2016-05-26: qty 1

## 2016-05-26 MED ORDER — LIDOCAINE-MENTHOL 3.6-1.25 % EX PTCH: 1.0000 | MEDICATED_PATCH | Freq: Once | CUTANEOUS | 0 refills | Status: AC

## 2016-05-26 MED ORDER — HYDROCODONE-ACETAMINOPHEN 5-325 MG PO TABS
1.0000 | ORAL_TABLET | Freq: Once | ORAL | Status: AC
  Administered 2016-05-26: 1 via ORAL
  Filled 2016-05-26: qty 1

## 2016-05-26 NOTE — ED Notes (Signed)
Pt verbalizes understanding of d/c instructions and denies any further needs at this time. 

## 2016-05-26 NOTE — Discharge Instructions (Signed)
All of your x-rays have been normal today. This is likely musculoskeletal pain. Take the naproxen as prescribed. I have also given the Robaxin which is a muscle relaxer. Do not drive or operate heavy machinery with this as it will make you drowsy. I also given you a lidocaine patch. You can apply this to the area of pain. Do not apply for longer than 12 hours and no more than 1 patch per day. I would soak in a hot tub with Epson salt. I have given you signs and symptoms of concussion. If you develops any these signs and symptoms please return to the ED. If the pain does not resolve the next 5-6 days return to the ED or follow-up with her primary care doctor.
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
## 2016-05-30 ENCOUNTER — Emergency Department (HOSPITAL_BASED_OUTPATIENT_CLINIC_OR_DEPARTMENT_OTHER)
Admission: EM | Admit: 2016-05-30 | Discharge: 2016-05-30 | Disposition: A | Discharge status: Home / Self Care | Payer: Managed Care, Other (non HMO) | Attending: Emergency Medicine | Admitting: Emergency Medicine

## 2016-05-30 ENCOUNTER — Encounter (HOSPITAL_BASED_OUTPATIENT_CLINIC_OR_DEPARTMENT_OTHER): Payer: Self-pay | Admitting: *Deleted

## 2016-05-30 DIAGNOSIS — Y999 Unspecified external cause status: Secondary | ICD-10-CM | POA: Insufficient documentation

## 2016-05-30 DIAGNOSIS — R51 Headache: Secondary | ICD-10-CM | POA: Insufficient documentation

## 2016-05-30 DIAGNOSIS — Y939 Activity, unspecified: Secondary | ICD-10-CM | POA: Diagnosis not present

## 2016-05-30 DIAGNOSIS — Y92410 Unspecified street and highway as the place of occurrence of the external cause: Secondary | ICD-10-CM | POA: Diagnosis not present

## 2016-05-30 DIAGNOSIS — R519 Headache, unspecified: Secondary | ICD-10-CM

## 2016-05-30 DIAGNOSIS — S0990XA Unspecified injury of head, initial encounter: Secondary | ICD-10-CM | POA: Diagnosis present

## 2016-05-30 MED ORDER — HYDROCODONE-ACETAMINOPHEN 5-325 MG PO TABS: 2.0000 | ORAL_TABLET | ORAL | 0 refills | Status: DC | PRN

## 2016-05-30 MED ORDER — CYCLOBENZAPRINE HCL 10 MG PO TABS: 10.0000 mg | ORAL_TABLET | Freq: Two times a day (BID) | ORAL | 0 refills | Status: DC | PRN

## 2016-05-30 MED FILL — CYCLOBENZAPRINE 10 MG TAB: 10 | 10 days supply | Qty: 20 | Fill #0

## 2016-05-30 MED FILL — HYDROCODON-APAP 5-325: 5-325 | 1 days supply | Qty: 10 | Fill #0
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
## 2016-05-30 NOTE — ED Notes (Signed)
Pt directed to pharmacy to pick up Rx. Note for work given 

## 2016-08-11 ENCOUNTER — Telehealth: Payer: Self-pay | Admitting: Behavioral Health

## 2016-08-11 ENCOUNTER — Encounter: Payer: Self-pay | Admitting: Behavioral Health

## 2016-08-11 NOTE — Addendum Note (Signed)
Addended by: Kathlen Brunswick on: 08/11/2016 05:35 PM   Modules accepted: Orders

## 2016-08-11 NOTE — Telephone Encounter (Signed)
Pre-Visit Call completed with patient and chart updated.   Pre-Visit Info documented in Specialty Comments under [facility].    

## 2016-08-11 NOTE — Telephone Encounter (Signed)
Unable to reach patient at time of Pre-Visit Call.  Left message for patient to return call when available.    
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
## 2016-08-15 ENCOUNTER — Ambulatory Visit (INDEPENDENT_AMBULATORY_CARE_PROVIDER_SITE_OTHER): Payer: Managed Care, Other (non HMO) | Admitting: Family Medicine

## 2016-08-15 ENCOUNTER — Encounter: Payer: Self-pay | Admitting: Family Medicine

## 2016-08-15 VITALS — BP 115/62 | HR 77 | Temp 98.4°F | Ht 65.0 in | Wt 147.0 lb

## 2016-08-15 DIAGNOSIS — Z130 Encounter for screening for diseases of the blood and blood-forming organs and certain disorders involving the immune mechanism: Secondary | ICD-10-CM | POA: Diagnosis not present

## 2016-08-15 DIAGNOSIS — R102 Pelvic and perineal pain: Secondary | ICD-10-CM

## 2016-08-15 DIAGNOSIS — Z13220 Encounter for screening for lipoid disorders: Secondary | ICD-10-CM

## 2016-08-15 DIAGNOSIS — Z23 Encounter for immunization: Secondary | ICD-10-CM | POA: Diagnosis not present

## 2016-08-15 DIAGNOSIS — Z1329 Encounter for screening for other suspected endocrine disorder: Secondary | ICD-10-CM | POA: Diagnosis not present

## 2016-08-15 DIAGNOSIS — Z131 Encounter for screening for diabetes mellitus: Secondary | ICD-10-CM | POA: Diagnosis not present

## 2016-08-15 LAB — COMPREHENSIVE METABOLIC PANEL
ALT: 7 U/L (ref 0–35)
AST: 13 U/L (ref 0–37)
Albumin: 3.8 g/dL (ref 3.5–5.2)
Alkaline Phosphatase: 56 U/L (ref 39–117)
BUN: 9 mg/dL (ref 6–23)
CALCIUM: 9 mg/dL (ref 8.4–10.5)
CHLORIDE: 104 meq/L (ref 96–112)
CO2: 28 meq/L (ref 19–32)
Creatinine, Ser: 0.77 mg/dL (ref 0.40–1.20)
GFR: 107.21 mL/min (ref >60.00)
Glucose, Bld: 59 mg/dL — ABNORMAL LOW (ref 70–99)
POTASSIUM: 3.7 meq/L (ref 3.5–5.1)
Sodium: 137 mEq/L (ref 135–145)
Total Bilirubin: 0.3 mg/dL (ref 0.2–1.2)
Total Protein: 6.9 g/dL (ref 6.0–8.3)

## 2016-08-15 LAB — LIPID PANEL
CHOL/HDL RATIO: 3
Cholesterol: 186 mg/dL (ref 0–200)
HDL: 53.7 mg/dL (ref >39.00)
LDL Cholesterol: 121 mg/dL — ABNORMAL HIGH (ref 0–99)
NonHDL: 132.77
TRIGLYCERIDES: 57 mg/dL (ref 0.0–149.0)
VLDL: 11.4 mg/dL (ref 0.0–40.0)

## 2016-08-15 LAB — CBC
HEMATOCRIT: 36.5 % (ref 36.0–46.0)
HEMOGLOBIN: 11.5 g/dL — AB (ref 12.0–15.0)
MCHC: 31.5 g/dL (ref 30.0–36.0)
MCV: 72.1 fl — AB (ref 78.0–100.0)
PLATELETS: 327 10*3/uL (ref 150.0–400.0)
RBC: 5.07 Mil/uL (ref 3.87–5.11)
RDW: 15.4 % (ref 11.5–15.5)
WBC: 6.1 10*3/uL (ref 4.0–10.5)

## 2016-08-15 LAB — TSH: TSH: 1.25 u[IU]/mL (ref 0.35–4.50)

## 2016-08-15 LAB — HEMOGLOBIN A1C: Hgb A1c MFr Bld: 5.6 % (ref 4.6–6.5)

## 2016-08-15 NOTE — Patient Instructions (Signed)
It was very nice to see you today! I will be in touch with your labs You got your tetanus booster today We will set you up to see GYN about your pelvic pains

## 2016-09-12 ENCOUNTER — Encounter: Payer: Self-pay | Admitting: Obstetrics & Gynecology

## 2016-09-12 ENCOUNTER — Ambulatory Visit (INDEPENDENT_AMBULATORY_CARE_PROVIDER_SITE_OTHER): Payer: Managed Care, Other (non HMO) | Admitting: Obstetrics & Gynecology

## 2016-09-12 VITALS — BP 129/86 | HR 84 | Ht 64.0 in | Wt 147.0 lb

## 2016-09-12 DIAGNOSIS — N946 Dysmenorrhea, unspecified: Secondary | ICD-10-CM | POA: Insufficient documentation

## 2016-09-12 DIAGNOSIS — Z3042 Encounter for surveillance of injectable contraceptive: Secondary | ICD-10-CM

## 2016-09-12 MED ORDER — MEDROXYPROGESTERONE ACETATE 150 MG/ML IM SUSP
150.0000 mg | Freq: Once | INTRAMUSCULAR | Status: AC
  Administered 2016-09-12: 150 mg via INTRAMUSCULAR

## 2016-09-12 NOTE — Progress Notes (Signed)
   Subjective:    Patient ID: Angela Shah, female    DOB: 19-Apr-1976, 41 y.o.   MRN: [id number]  HPI 41 yo S AA P4 (24, 35, 54, and 56 yo kids, 3 grands) here today to discuss pelvic pain. She report pelvic pain, "all over", "only with my cycle".  She has tried IBU 800mg  with no help, Pamprim, heating pad, no help. She reports that the pain started with her BTL.   Review of Systems Pap smear in 2016 in [facility], normal   She had a BTL (falope ring) for about 10 years. She was on depo provera prior to that. She had amenorrhea while on depo in th past Objective:   Physical Exam Pleasant BFNAD Breathing, conversing, and ambulating normally Abd- benign Bimanual exam reveals a globular, mobile, 6 week size uterus (c/w posterior fibroid)       Assessment & Plan:  Dysmenorrhea- I strongly suspect endometriosis Because she has a h/o DVT (no longer on blood thinners now), I cannot use OCPs and would like to defer surgery if possible. I will give her a trial of depo provera. RTC 3 months If still has pain, and wants to risk another DVT, then would do a l/s (and remove ring/tube) Schedule gyn u/s

## 2016-09-12 NOTE — Addendum Note (Signed)
Addended by: Phill Myron on: 09/12/2016 10:19 AM   Modules accepted: Orders

## 2016-09-19 ENCOUNTER — Ambulatory Visit (HOSPITAL_BASED_OUTPATIENT_CLINIC_OR_DEPARTMENT_OTHER)
Admission: RE | Admit: 2016-09-19 | Discharge: 2016-09-19 | Disposition: A | Discharge status: Home / Self Care | Payer: Managed Care, Other (non HMO) | Source: Ambulatory Visit | Attending: Obstetrics & Gynecology | Admitting: Obstetrics & Gynecology

## 2016-09-19 DIAGNOSIS — N946 Dysmenorrhea, unspecified: Secondary | ICD-10-CM | POA: Diagnosis not present

## 2016-09-19 DIAGNOSIS — D259 Leiomyoma of uterus, unspecified: Secondary | ICD-10-CM | POA: Insufficient documentation

## 2016-12-19 ENCOUNTER — Ambulatory Visit (INDEPENDENT_AMBULATORY_CARE_PROVIDER_SITE_OTHER): Payer: 59 | Admitting: Family Medicine

## 2016-12-19 VITALS — BP 119/77 | HR 97 | Wt 155.0 lb

## 2016-12-19 DIAGNOSIS — N946 Dysmenorrhea, unspecified: Secondary | ICD-10-CM | POA: Diagnosis not present

## 2016-12-19 DIAGNOSIS — Z3042 Encounter for surveillance of injectable contraceptive: Secondary | ICD-10-CM

## 2016-12-19 MED ORDER — MEDROXYPROGESTERONE ACETATE 150 MG/ML IM SUSP
150.0000 mg | Freq: Once | INTRAMUSCULAR | Status: AC
  Administered 2016-12-19: 150 mg via INTRAMUSCULAR

## 2016-12-19 NOTE — Progress Notes (Signed)
   Subjective:    Patient ID: Angela Shah, female    DOB: 1976/03/08, 41 y.o.   MRN: [id number]  HPI Patient seen for follow-up of dysmenorrhea. Patient had Depo-Medrol approximately 3 months ago. Initially bled for about 3 weeks, but then had no bleeding until she had spotting a couple days ago. She denies pelvic pain, abnormal bleeding. No other complicated she was per patient.   Review of Systems     Objective:   Physical Exam  Constitutional: She is oriented to person, place, and time. She appears well-developed and well-nourished.  HENT:  Head: Normocephalic and atraumatic.  Pulmonary/Chest: Effort normal.  Neurological: She is alert and oriented to person, place, and time.  Skin: Skin is warm and dry.  Psychiatric: She has a normal mood and affect. Her behavior is normal. Judgment and thought content normal.      Assessment & Plan:  1. Dysmenorrhea Continue Depo-Medrol Provera. Discussed possibility of weight gain. Ultrasound results reviewed with patient, which showed a 5 cm fibroids and endometrial thickness of 12 mm. If patient's first experience breakthrough bleeding for several weeks, return to office sooner.

## 2016-12-19 NOTE — Progress Notes (Addendum)
Pt states she is here for depo.  Pt states she is doing well on depo. States increase bleeding after first dose and is trying to start a cycle now.  Depo was last given 09/12/16, pt has had BTL.  Depo to be given today out of office stock, pt has not had Rx sent to pharmacy. Pt tolerated injection well.   Administrations This Visit    medroxyPROGESTERone (DEPO-PROVERA) injection 150 mg    Admin Date 12/19/2016 Action Given Dose 150 mg Route Intramuscular Administered By Valene Bors, CMA

## 2017-02-21 ENCOUNTER — Encounter: Payer: Self-pay | Admitting: Advanced Practice Midwife

## 2017-03-02 ENCOUNTER — Encounter: Payer: Self-pay | Admitting: Family Medicine

## 2017-03-02 ENCOUNTER — Ambulatory Visit (INDEPENDENT_AMBULATORY_CARE_PROVIDER_SITE_OTHER): Payer: 59 | Admitting: Family Medicine

## 2017-03-02 ENCOUNTER — Other Ambulatory Visit (HOSPITAL_COMMUNITY)
Admission: RE | Admit: 2017-03-02 | Discharge: 2017-03-02 | Disposition: A | Discharge status: Home / Self Care | Payer: 59 | Source: Ambulatory Visit | Attending: Family Medicine | Admitting: Family Medicine

## 2017-03-02 VITALS — BP 122/78 | HR 93 | Ht 64.0 in | Wt 160.0 lb

## 2017-03-02 DIAGNOSIS — Z3202 Encounter for pregnancy test, result negative: Secondary | ICD-10-CM

## 2017-03-02 DIAGNOSIS — N946 Dysmenorrhea, unspecified: Secondary | ICD-10-CM

## 2017-03-02 DIAGNOSIS — Z124 Encounter for screening for malignant neoplasm of cervix: Secondary | ICD-10-CM

## 2017-03-02 DIAGNOSIS — N644 Mastodynia: Secondary | ICD-10-CM

## 2017-03-02 DIAGNOSIS — Z0000 Encounter for general adult medical examination without abnormal findings: Secondary | ICD-10-CM | POA: Diagnosis not present

## 2017-03-02 DIAGNOSIS — Z01419 Encounter for gynecological examination (general) (routine) without abnormal findings: Secondary | ICD-10-CM | POA: Diagnosis not present

## 2017-03-02 MED ORDER — MEDROXYPROGESTERONE ACETATE 150 MG/ML IM SUSP
150.0000 mg | Freq: Once | INTRAMUSCULAR | Status: AC
  Administered 2017-03-02: 150 mg via INTRAMUSCULAR

## 2017-03-02 NOTE — Progress Notes (Signed)
Pt states she is having spotting with Depo. Pt states she has occasional left side breast pain.

## 2017-03-02 NOTE — Addendum Note (Signed)
Addended by: Lewie Loron D on: 03/02/2017 10:15 AM   Modules accepted: Orders

## 2017-03-02 NOTE — Progress Notes (Signed)
GYNECOLOGY ANNUAL PREVENTATIVE CARE ENCOUNTER NOTE  Subjective:   Angela Shah is a 41 y.o. [phone] female here for a routine annual gynecologic exam.  Current complaints: intermittent breast pain in left breast. No discharge, red areas, lumps. Lasts for a couple of minutes. Feels similar to breast pain when she had menses. Still spotting from Depo - every day. Denies abnormal vaginal bleeding, discharge, pelvic pain, problems with intercourse or other gynecologic concerns.    Gynecologic History No LMP recorded. Patient is sexually active  Contraception: Depo-Provera injections Last Pap: 2015. Results were: normal Last mammogram: n/a.  Obstetric History OB History  Gravida Para Term Preterm AB Living  4 4 2 2   4   SAB TAB Ectopic Multiple Live Births               # Outcome Date GA Lbr Len/2nd Weight Sex Delivery Anes PTL Lv  4 Preterm           3 Preterm           2 Term           1 Term               Past Medical History:  Diagnosis Date  . DVT (deep venous thrombosis) ([place])     Past Surgical History:  Procedure Laterality Date  . Ring tubal ligation      Current Outpatient Prescriptions on File Prior to Visit  Medication Sig Dispense Refill  . ibuprofen (ADVIL,MOTRIN) 800 MG tablet Take 800 mg by mouth every 8 (eight) hours as needed.     No current facility-administered medications on file prior to visit.     No Known Allergies  Social History   Social History  . Marital status: Single    Spouse name: N/A  . Number of children: N/A  . Years of education: N/A   Occupational History  . Not on file.   Social History Main Topics  . Smoking status: Never Smoker  . Smokeless tobacco: Never Used  . Alcohol use No  . Drug use: No  . Sexual activity: Yes    Birth control/ protection: Surgical, Injection   Other Topics Concern  . Not on file   Social History Narrative  . No narrative on file    Family History  Problem Relation Age of Onset    . Heart disease Mother   . Cancer Sister        Ovarian cancer  . Cancer Maternal Grandmother        Lung cancer    The following portions of the patient's history were reviewed and updated as appropriate: allergies, current medications, past family history, past medical history, past social history, past surgical history and problem list.  Review of Systems Pertinent items are noted in HPI.   Objective:  BP 122/78   Pulse 93   Ht 5\' 4"  (1.626 m)   Wt 160 lb (72.6 kg)   BMI 27.46 kg/m  CONSTITUTIONAL: Well-developed, well-nourished female in no acute distress.  HENT:  Normocephalic, atraumatic, External right and left ear normal. Oropharynx is clear and moist EYES: Conjunctivae and EOM are normal. Pupils are equal, round, and reactive to light. No scleral icterus.  NECK: Normal range of motion, supple, no masses.  Normal thyroid.   CARDIOVASCULAR: Normal heart rate noted, regular rhythm RESPIRATORY: Clear to auscultation bilaterally. Effort and breath sounds normal, no problems with respiration noted. BREASTS: Symmetric in size. No masses, skin changes, nipple  drainage, or lymphadenopathy. ABDOMEN: Soft, normal bowel sounds, no distention noted.  No tenderness, rebound or guarding.  PELVIC: Normal appearing external genitalia; normal appearing vaginal mucosa and cervix.  No abnormal discharge noted.  Pap smear obtained.  Normal uterine size, no other palpable masses, no uterine or adnexal tenderness. MUSCULOSKELETAL: Normal range of motion. No tenderness.  No cyanosis, clubbing, or edema.  2+ distal pulses. SKIN: Skin is warm and dry. No rash noted. Not diaphoretic. No erythema. No pallor. NEUROLOGIC: Alert and oriented to person, place, and time. Normal reflexes, muscle tone coordination. No cranial nerve deficit noted. PSYCHIATRIC: Normal mood and affect. Normal behavior. Normal judgment and thought content.  Assessment:  Annual gynecologic examination with pap smear   Plan:   1. Annual exam: Will follow up results of pap smear and manage accordingly. Discussed exercise and diet Calcium and Vitamin D supplementation discussed  2. Breast Pain Vit E for breast pain - if continues, may need imaging.  3. Dysmenorrhea Continue depo. Will give depo today. If spots after this injection, will give provera tabs. May need depo every 2 months.   Routine preventative health maintenance measures emphasized. Please refer to After Visit Summary for other counseling recommendations.    Loma Boston, [place] for [employer]

## 2017-03-02 NOTE — Patient Instructions (Signed)

## 2017-03-02 NOTE — Progress Notes (Addendum)
Pt was given depo at today's visit.  Depo was supplied by office stock. Pt tolerated well.  Administrations This Visit    medroxyPROGESTERone (DEPO-PROVERA) injection 150 mg    Admin Date 03/02/2017 Action Given Dose 150 mg Route Intramuscular Administered By Valene Bors, CMA

## 2017-03-05 NOTE — Progress Notes (Deleted)
[place] at [facility] [address], [place], [place] [address] [phone]  Date:  03/06/2017   Name:  Angela Shah   DOB:  [birth year]   MRN:  [id number]  PCP:  Darreld Mclean, MD    Chief Complaint: No chief complaint on file.   History of Present Illness:  Angela Shah is a 41 y.o. very pleasant female patient who presents with the following:  ***  Patient Active Problem List   Diagnosis Date Noted  . Dysmenorrhea 09/12/2016    Past Medical History:  Diagnosis Date  . DVT (deep venous thrombosis) ([place])     Past Surgical History:  Procedure Laterality Date  . Ring tubal ligation      Social History  Substance Use Topics  . Smoking status: Never Smoker  . Smokeless tobacco: Never Used  . Alcohol use No    Family History  Problem Relation Age of Onset  . Heart disease Mother   . Cancer Sister        Ovarian cancer  . Cancer Maternal Grandmother        Lung cancer    No Known Allergies  Medication list has been reviewed and updated.  Current Outpatient Prescriptions on File Prior to Visit  Medication Sig Dispense Refill  . ibuprofen (ADVIL,MOTRIN) 800 MG tablet Take 800 mg by mouth every 8 (eight) hours as needed.     No current facility-administered medications on file prior to visit.     Review of Systems:  ***  Physical Examination: There were no vitals filed for this visit. There were no vitals filed for this visit. There is no height or weight on file to calculate BMI. Ideal Body Weight:    ***  Assessment and Plan: ***  Signed Lamar Blinks, MD

## 2017-03-06 ENCOUNTER — Ambulatory Visit: Payer: 59 | Admitting: Family Medicine

## 2017-03-06 DIAGNOSIS — Z0289 Encounter for other administrative examinations: Secondary | ICD-10-CM

## 2017-03-06 LAB — CYTOLOGY - PAP: Diagnosis: NEGATIVE

## 2017-03-07 ENCOUNTER — Encounter: Payer: Self-pay | Admitting: Family Medicine

## 2017-03-08 MED ORDER — MEDROXYPROGESTERONE ACETATE 10 MG PO TABS: 20.0000 mg | ORAL_TABLET | Freq: Every day | ORAL | 3 refills | Status: DC

## 2017-03-08 NOTE — Telephone Encounter (Signed)
Needs to move depo provera injections to every 2 months.

## 2017-06-09 ENCOUNTER — Telehealth: Payer: Self-pay

## 2017-06-09 MED ORDER — MEDROXYPROGESTERONE ACETATE 150 MG/ML IM SUSP: 150.0000 mg | INTRAMUSCULAR | 2 refills | Status: DC

## 2017-06-09 NOTE — Telephone Encounter (Signed)
Refill of Depo Provera sent to pharmacy.

## 2017-06-12 ENCOUNTER — Ambulatory Visit: Payer: 59

## 2017-06-15 ENCOUNTER — Ambulatory Visit (INDEPENDENT_AMBULATORY_CARE_PROVIDER_SITE_OTHER): Payer: 59

## 2017-06-15 DIAGNOSIS — Z3042 Encounter for surveillance of injectable contraceptive: Secondary | ICD-10-CM

## 2017-06-15 MED ORDER — MEDROXYPROGESTERONE ACETATE 150 MG/ML IM SUSP
150.0000 mg | INTRAMUSCULAR | Status: DC
  Administered 2017-06-15 – 2018-12-20 (×5): 150 mg via INTRAMUSCULAR

## 2017-06-15 MED ORDER — MEDROXYPROGESTERONE ACETATE 10 MG PO TABS: 20.0000 mg | ORAL_TABLET | Freq: Every day | ORAL | 3 refills | Status: DC

## 2017-06-15 NOTE — Progress Notes (Signed)
Last annual 03-02-17. Patient received last depo on 03-14-17 at a different location. Patient states that Dr. Nehemiah Settle recommended she receive her Depo Provera every closer to every two months so recommended she return two weeks early around August 14 2017 for her next injection. Patient supplied Depo Provera. Patient also states that she never picked up the provera script so she would like it resent to pharmacy. She is still having spotting daily. Kathrene Alu RNBSN

## 2017-08-14 ENCOUNTER — Other Ambulatory Visit (INDEPENDENT_AMBULATORY_CARE_PROVIDER_SITE_OTHER): Payer: 59

## 2017-08-14 DIAGNOSIS — Z3042 Encounter for surveillance of injectable contraceptive: Secondary | ICD-10-CM | POA: Diagnosis not present

## 2017-08-14 NOTE — Progress Notes (Signed)
Chart reviewed - agree with RN documentation.   

## 2017-08-14 NOTE — Progress Notes (Signed)
Patient presents for Depo Provera. Patient is early for Depo Provera but instructed by Dr. Nehemiah Settle to return in two months from last one to see if we can get her bleeding to stop. Patient reports she is still having bleeding. Scheduled patient to come in to speak with Dr. Nehemiah Settle about other options.

## 2017-08-21 ENCOUNTER — Encounter: Payer: Self-pay | Admitting: Family Medicine

## 2017-08-21 ENCOUNTER — Ambulatory Visit (INDEPENDENT_AMBULATORY_CARE_PROVIDER_SITE_OTHER): Payer: 59 | Admitting: Family Medicine

## 2017-08-21 VITALS — BP 129/96 | HR 89 | Ht 64.0 in | Wt 171.0 lb

## 2017-08-21 DIAGNOSIS — Z86718 Personal history of other venous thrombosis and embolism: Secondary | ICD-10-CM | POA: Diagnosis not present

## 2017-08-21 DIAGNOSIS — N946 Dysmenorrhea, unspecified: Secondary | ICD-10-CM

## 2017-08-21 DIAGNOSIS — N921 Excessive and frequent menstruation with irregular cycle: Secondary | ICD-10-CM

## 2017-08-21 DIAGNOSIS — D252 Subserosal leiomyoma of uterus: Secondary | ICD-10-CM | POA: Diagnosis not present

## 2017-08-21 MED ORDER — MEGESTROL ACETATE 40 MG PO TABS: 80.0000 mg | ORAL_TABLET | Freq: Two times a day (BID) | ORAL | 5 refills | Status: DC

## 2017-08-21 NOTE — Progress Notes (Signed)
   Subjective:    Patient ID: Angela Shah, female    DOB: 1976-04-28, 42 y.o.   MRN: [id number]  HPI Patient seen for breakthrough bleeding on Depo provera. Has been taking Provera 20mg  daily in addition to the depo-Provera. She also received her depo injection at the 2 month mark this past injection. She still has breakthrough spotting on a daily basis. Denies pelvic pain or dysmenorrhea.  Review of Systems     Objective:   Physical Exam  Constitutional: She is oriented to person, place, and time. She appears well-developed and well-nourished.  Cardiovascular: Normal rate.  Pulmonary/Chest: Effort normal and breath sounds normal.  Abdominal: Soft. Bowel sounds are normal.  Neurological: She is alert and oriented to person, place, and time.  Skin: Skin is warm and dry.  Psychiatric: She has a normal mood and affect. Her behavior is normal. Judgment and thought content normal.      Assessment & Plan:  1. Breakthrough bleeding on Depo-Provera Has large subserosal and intramural fibroids. Will change patient from provera tablets to megace 80mg  BID. If still has breakthrough spotting, may need repeat US, endometrial sampling, and surgical consult.   Has history of DVT, not candidate for estrogen contraception.  2. Dysmenorrhea Controlled.

## 2017-08-28 ENCOUNTER — Encounter (INDEPENDENT_AMBULATORY_CARE_PROVIDER_SITE_OTHER): Payer: Self-pay

## 2017-09-05 ENCOUNTER — Telehealth: Payer: Self-pay | Admitting: Family Medicine

## 2017-09-05 DIAGNOSIS — D259 Leiomyoma of uterus, unspecified: Secondary | ICD-10-CM | POA: Diagnosis not present

## 2017-09-05 DIAGNOSIS — Z3202 Encounter for pregnancy test, result negative: Secondary | ICD-10-CM | POA: Diagnosis not present

## 2017-09-05 DIAGNOSIS — R102 Pelvic and perineal pain: Secondary | ICD-10-CM | POA: Diagnosis not present

## 2017-09-05 NOTE — Telephone Encounter (Signed)
Pt called states her pain from fibroids is severe today. Offered pt appt tomorrow 09/06/17 also advised her to go to [facility] for severe pain. Pt states she is going to [facility] today for pain relief.

## 2017-10-29 NOTE — Progress Notes (Signed)
[place] at [facility] [address], [place], [place] [address] [phone]  Date:  10/30/2017   Name:  Angela Shah   DOB:  03/03/1976   MRN:  [id number]  PCP:  Darreld Mclean, MD    Chief Complaint: No chief complaint on file.   History of Present Illness:  Angela Shah is a 42 y.o. very pleasant female patient who presents with the following:  Here today to discuss headache I last saw her over a year ago, at which time she had c/o pelvic pain.  She has been to see GYN; per their most recent note: 1. Breakthrough bleeding on Depo-Provera Has large subserosal and intramural fibroids. Will change patient from provera tablets to megace 80mg  BID. If still has breakthrough spotting, may need repeat US, endometrial sampling, and surgical consult.  Has history of DVT, not candidate for estrogen contraception. She notes that she is doing depo- provera shots every 2 months instead of every 3 and also megace to prevent breakthrough bleeding- this is helping her, her sx are much reduced  Today has several concerns as it turns out-   She has noted a HA for a couple of weeks- she has a history of migraine with visual aura per her report Last week she had an severe HA with aura while at work and her vision was blurry-  She laid down and rested and sx got better, reports that her vision is now back to normal  Right now she notes a "mild headache" in her left face but her more severe HA is resolved  She also has swelling in her left leg for a week or 10 days.    Swelling will wax and wane, she has some "shooting pains in my leg and my toes are numb."  She did have a DVT several years ago- 2011 She only ever had one DVT This does remind her of when she had a clot in the past  She also notes SOB over the last 10 days or so- thought this was just due to weight gain. Notes that when she walks she will get more out of breath than is normal  for her No hemoptysis No fever noted  Pt at first denied CP, but on second interview states that she is having CP intermittently for the last 1-2 weeks, each episodes may last 5- 10 minutes.  In fact she had an episode just now.    Had planned to do LE [place] and also CT angio outpt.  However as pt is now having complaint of CP and has an abnormal EKG will refer to the ED at this point  Pt does seem to have tendency towards pan positive ROS  Patient Active Problem List   Diagnosis Date Noted  . Fibroids, subserous 08/21/2017  . History of DVT (deep vein thrombosis) 08/21/2017  . Dysmenorrhea 09/12/2016    Past Medical History:  Diagnosis Date  . DVT (deep venous thrombosis) ([place])     Past Surgical History:  Procedure Laterality Date  . Ring tubal ligation      Social History   Tobacco Use  . Smoking status: Never Smoker  . Smokeless tobacco: Never Used  Substance Use Topics  . Alcohol use: No  . Drug use: No    Family History  Problem Relation Age of Onset  . Heart disease Mother   . Cancer Sister        Ovarian  cancer  . Cancer Maternal Grandmother        Lung cancer    No Known Allergies  Medication list has been reviewed and updated.  Current Outpatient Medications on File Prior to Visit  Medication Sig Dispense Refill  . ibuprofen (ADVIL,MOTRIN) 800 MG tablet Take 800 mg by mouth every 8 (eight) hours as needed.    . medroxyPROGESTERone (DEPO-PROVERA) 150 MG/ML injection Inject 1 mL (150 mg total) into the muscle every 3 (three) months. 1 mL 2  . megestrol (MEGACE) 40 MG tablet Take 2 tablets (80 mg total) by mouth 2 (two) times daily. Can increase to two tablets twice a day in the event of heavy bleeding 120 tablet 5   Current Facility-Administered Medications on File Prior to Visit  Medication Dose Route Frequency Provider Last Rate Last Dose  . medroxyPROGESTERone (DEPO-PROVERA) injection 150 mg  150 mg Intramuscular Q90 days Truett Mainland, DO   150 mg at  08/14/17 3500    Review of Systems:  As per HPI- otherwise negative.   Physical Examination: Vitals:   10/30/17 1435  BP: 118/86  Pulse: (!) 104  Resp: 16  Temp: 98.3 F (36.8 C)  SpO2: 99%   Vitals:   10/30/17 1435  Weight: 174 lb 12.8 oz (79.3 kg)  Height: 5\' 4"  (1.626 m)   Body mass index is 30 kg/m. Ideal Body Weight: Weight in (lb) to have BMI = 25: 145.3  GEN: WDWN, NAD, Non-toxic, A & O x 3, obese, otherwise looks well  HEENT: Atraumatic, Normocephalic. Neck supple. No masses, No LAD. Bilateral TM wnl, oropharynx normal.  PEERL,EOMI.   Ears and Nose: No external deformity. CV: RRR, mild tachycardia, No M/G/R. No JVD. No thrill. No extra heart sounds. PULM: CTA B, no wheezes, crackles, rhonchi. No retractions. No resp. distress. No accessory muscle use. EXTR: No c/c.  Left ankle/ foot is edematous. Pt notes tenderness in her left calf  NEURO Normal gait.  PSYCH: Normally interactive. Conversant. Not depressed or anxious appearing.  Calm demeanor.   EKG: no old tracing for comparison. Some T wave change and low voltage.  Machine says old infarct   Results for orders placed or performed in visit on 10/30/17  POCT urine pregnancy  Result Value Ref Range   Preg Test, Ur Negative Negative    Assessment and Plan: Left leg swelling - Plan: US Venous Img Lower Unilateral Left  History of DVT (deep vein thrombosis) - Plan: US Venous Img Lower Unilateral Left, CT Angio Chest W/Cm &/Or Wo Cm  SOB (shortness of breath) - Plan: EKG 12-Lead, POCT urine pregnancy, CT Angio Chest W/Cm &/Or Wo Cm  Here today with sx suspicious for a recurrent DVT and perhaps PE Pt also reports chest pain Will refer to ED for further evaluation canceled CT angio and LE doppler  Signed Lamar Blinks, MD

## 2017-10-30 ENCOUNTER — Ambulatory Visit (HOSPITAL_BASED_OUTPATIENT_CLINIC_OR_DEPARTMENT_OTHER): Payer: 59

## 2017-10-30 ENCOUNTER — Ambulatory Visit (INDEPENDENT_AMBULATORY_CARE_PROVIDER_SITE_OTHER): Payer: 59 | Admitting: Family Medicine

## 2017-10-30 ENCOUNTER — Encounter (HOSPITAL_BASED_OUTPATIENT_CLINIC_OR_DEPARTMENT_OTHER): Payer: Self-pay | Admitting: Emergency Medicine

## 2017-10-30 ENCOUNTER — Encounter: Payer: Self-pay | Admitting: Family Medicine

## 2017-10-30 ENCOUNTER — Emergency Department (HOSPITAL_BASED_OUTPATIENT_CLINIC_OR_DEPARTMENT_OTHER)
Admission: EM | Admit: 2017-10-30 | Discharge: 2017-10-30 | Disposition: A | Discharge status: Home / Self Care | Payer: 59 | Attending: Physician Assistant | Admitting: Physician Assistant

## 2017-10-30 ENCOUNTER — Emergency Department (HOSPITAL_BASED_OUTPATIENT_CLINIC_OR_DEPARTMENT_OTHER): Payer: 59

## 2017-10-30 ENCOUNTER — Other Ambulatory Visit: Payer: Self-pay

## 2017-10-30 VITALS — BP 118/86 | HR 104 | Temp 98.3°F | Resp 16 | Ht 64.0 in | Wt 174.8 lb

## 2017-10-30 DIAGNOSIS — Z86718 Personal history of other venous thrombosis and embolism: Secondary | ICD-10-CM

## 2017-10-30 DIAGNOSIS — M7989 Other specified soft tissue disorders: Secondary | ICD-10-CM

## 2017-10-30 DIAGNOSIS — R2242 Localized swelling, mass and lump, left lower limb: Secondary | ICD-10-CM | POA: Insufficient documentation

## 2017-10-30 DIAGNOSIS — Z79899 Other long term (current) drug therapy: Secondary | ICD-10-CM | POA: Insufficient documentation

## 2017-10-30 DIAGNOSIS — R0602 Shortness of breath: Secondary | ICD-10-CM | POA: Diagnosis not present

## 2017-10-30 DIAGNOSIS — M79662 Pain in left lower leg: Secondary | ICD-10-CM | POA: Diagnosis not present

## 2017-10-30 DIAGNOSIS — R079 Chest pain, unspecified: Secondary | ICD-10-CM | POA: Insufficient documentation

## 2017-10-30 LAB — BASIC METABOLIC PANEL
ANION GAP: 6 (ref 5–15)
BUN: 10 mg/dL (ref 6–20)
CO2: 25 mmol/L (ref 22–32)
Calcium: 8.7 mg/dL — ABNORMAL LOW (ref 8.9–10.3)
Chloride: 107 mmol/L (ref 101–111)
Creatinine, Ser: 0.91 mg/dL (ref 0.44–1.00)
GFR calc Af Amer: >60 mL/min (ref >60)
GLUCOSE: 96 mg/dL (ref 65–99)
Potassium: 4 mmol/L (ref 3.5–5.1)
Sodium: 138 mmol/L (ref 135–145)

## 2017-10-30 LAB — CBC
HEMATOCRIT: 38.9 % (ref 36.0–46.0)
HEMOGLOBIN: 13.1 g/dL (ref 12.0–15.0)
MCH: 23.6 pg — ABNORMAL LOW (ref 26.0–34.0)
MCHC: 33.7 g/dL (ref 30.0–36.0)
MCV: 70.1 fL — ABNORMAL LOW (ref 78.0–100.0)
Platelets: 320 10*3/uL (ref 150–400)
RBC: 5.55 MIL/uL — ABNORMAL HIGH (ref 3.87–5.11)
RDW: 16.3 % — ABNORMAL HIGH (ref 11.5–15.5)
WBC: 7 10*3/uL (ref 4.0–10.5)

## 2017-10-30 LAB — TROPONIN I: Troponin I: <0.03 ng/mL (ref <0.03)

## 2017-10-30 LAB — POCT URINE PREGNANCY: PREG TEST UR: NEGATIVE

## 2017-10-30 NOTE — ED Triage Notes (Signed)
Pt went to see her PCP regarding a headache. She states she developed L side chest pain while she was there, sharp in nature, non radiating. Also reports she has had L leg swelling x 2-3 days with pain. Pt is on birth control, non smoker, denies long travel.

## 2017-10-30 NOTE — ED Provider Notes (Signed)
[place] EMERGENCY DEPARTMENT Provider Note   CSN: [id number] Arrival date & time: 10/30/17  1523     History   Chief Complaint Chief Complaint  Patient presents with  . Chest Pain  . Leg Swelling    HPI Angela Shah is a 42 y.o. female.  The history is provided by the patient and medical records. No language interpreter was used.  Chest Pain   Pertinent negatives include no palpitations.   Angela Shah is a 42 y.o. female  with a PMH of prior DVT who presents to the Emergency Department from primary care office for concerns of left lower extremity swelling.  She states that her swelling has been going on for about a week or so.  It will wax and wane throughout the day.  She will have intermittent shooting pains up her calf.  She also notes intermittent chest pain for the last 2 weeks.  Episodes are sharp, lasting about 5 to 10 minutes.  She denies pleuritic component or exertional component.  No medications taken prior to arrival for symptom. Denies shortness of breath to me. Appears that she informed PCP of shortness of breath at appointment earlier.   Past Medical History:  Diagnosis Date  . DVT (deep venous thrombosis) [facility])     Patient Active Problem List   Diagnosis Date Noted  . Fibroids, subserous 08/21/2017  . History of DVT (deep vein thrombosis) 08/21/2017  . Dysmenorrhea 09/12/2016    Past Surgical History:  Procedure Laterality Date  . Ring tubal ligation       OB History    Gravida  4   Para  4   Term  2   Preterm  2   AB      Living  4     SAB      TAB      Ectopic      Multiple      Live Births               Home Medications    Prior to Admission medications   Medication Sig Start Date End Date Taking? Authorizing Provider  ibuprofen (ADVIL,MOTRIN) 800 MG tablet Take 800 mg by mouth every 8 (eight) hours as needed.    [provider]  medroxyPROGESTERone (DEPO-PROVERA) 150 MG/ML injection Inject  1 mL (150 mg total) into the muscle every 3 (three) months. 06/09/17   Lavonia Drafts, MD  megestrol (MEGACE) 40 MG tablet Take 2 tablets (80 mg total) by mouth 2 (two) times daily. Can increase to two tablets twice a day in the event of heavy bleeding 08/21/17   Truett Mainland, DO    Family History Family History  Problem Relation Age of Onset  . Heart disease Mother   . Cancer Sister        Ovarian cancer  . Cancer Maternal Grandmother        Lung cancer    Social History Social History   Tobacco Use  . Smoking status: Never Smoker  . Smokeless tobacco: Never Used  Substance Use Topics  . Alcohol use: No  . Drug use: No     Allergies   Patient has no known allergies.   Review of Systems Review of Systems  Cardiovascular: Positive for chest pain and leg swelling. Negative for palpitations.  All other systems reviewed and are negative.    Physical Exam Updated Vital Signs BP (!) 124/98 (BP Location: Right Arm)   Pulse 81  Temp 99.2 F (37.3 C) (Oral)   Resp 18   Ht 5\' 4"  (1.626 m)   Wt 79.4 kg (175 lb)   SpO2 100%   BMI 30.04 kg/m   Physical Exam  Constitutional: She is oriented to person, place, and time. She appears well-developed and well-nourished. No distress.  HENT:  Head: Normocephalic and atraumatic.  Cardiovascular: Normal rate, regular rhythm and normal heart sounds.  No murmur heard. Pulmonary/Chest: Effort normal and breath sounds normal. No respiratory distress.  Abdominal: Soft. She exhibits no distension. There is no tenderness.  Musculoskeletal:  Pedal edema to left LE.   Neurological: She is alert and oriented to person, place, and time.  Skin: Skin is warm and dry.  Nursing note and vitals reviewed.   ED Treatments / Results  Labs (all labs ordered are listed, but only abnormal results are displayed) Labs Reviewed  BASIC METABOLIC PANEL - Abnormal; Notable for the following components:      Result Value   Calcium 8.7  (*)    All other components within normal limits  CBC - Abnormal; Notable for the following components:   RBC 5.55 (*)    MCV 70.1 (*)    MCH 23.6 (*)    RDW 16.3 (*)    All other components within normal limits  TROPONIN I  PREGNANCY, URINE    EKG EKG Interpretation  Date/Time:  Monday October 30 2017 15:30:43 EDT Ventricular Rate:  83 PR Interval:  160 QRS Duration: 66 QT Interval:  350 QTC Calculation: 411 R Axis:   11 Text Interpretation:  Normal sinus rhythm Anterior infarct , age undetermined Abnormal ECG No old tracing to compare Confirmed by Malvin Johns [phone]) on 10/30/2017 3:33:20 PM   Radiology Dg Chest 2 View  Result Date: 10/30/2017 CLINICAL DATA:  Acute LEFT chest pain for 2 days. EXAM: CHEST - 2 VIEW COMPARISON:  05/26/2016 and prior radiographs FINDINGS: The cardiomediastinal silhouette is unremarkable. There is no evidence of focal airspace disease, pulmonary edema, suspicious pulmonary nodule/mass, pleural effusion, or pneumothorax. No acute bony abnormalities are identified. IMPRESSION: No active cardiopulmonary disease. Electronically Signed   By: Margarette Canada M.D.   On: 10/30/2017 15:52   US Venous Img Lower Unilateral Left  Result Date: 10/30/2017 CLINICAL DATA:  Left lower extremity pain and swelling. EXAM: Left LOWER EXTREMITY VENOUS DOPPLER ULTRASOUND TECHNIQUE: Gray-scale sonography with graded compression, as well as color Doppler and duplex ultrasound were performed to evaluate the lower extremity deep venous systems from the level of the common femoral vein and including the common femoral, femoral, profunda femoral, popliteal and calf veins including the posterior tibial, peroneal and gastrocnemius veins when visible. The superficial great saphenous vein was also interrogated. Spectral Doppler was utilized to evaluate flow at rest and with distal augmentation maneuvers in the common femoral, femoral and popliteal veins. COMPARISON:  Ultrasound of November 12, 2014. FINDINGS: Contralateral Common Femoral Vein: Respiratory phasicity is normal and symmetric with the symptomatic side. No evidence of thrombus. Normal compressibility. Common Femoral Vein: No evidence of thrombus. Normal compressibility, respiratory phasicity and response to augmentation. Saphenofemoral Junction: No evidence of thrombus. Normal compressibility and flow on color Doppler imaging. Profunda Femoral Vein: No evidence of thrombus. Normal compressibility and flow on color Doppler imaging. Femoral Vein: No evidence of thrombus. Normal compressibility, respiratory phasicity and response to augmentation. Popliteal Vein: No evidence of thrombus. Normal compressibility, respiratory phasicity and response to augmentation. Calf Veins: No evidence of thrombus. Normal compressibility and flow on  color Doppler imaging. Superficial Great Saphenous Vein: No evidence of thrombus. Normal compressibility. Venous Reflux:  None. Other Findings:  None. IMPRESSION: No evidence of deep venous thrombosis seen in left lower extremity. Electronically Signed   By: Marijo Conception, M.D.   On: 10/30/2017 18:37    Procedures Procedures (including critical care time)  Medications Ordered in ED Medications - No data to display   Initial Impression / Assessment and Plan / ED Course  I have reviewed the triage vital signs and the nursing notes.  Pertinent labs & imaging results that were available during my care of the patient were reviewed by me and considered in my medical decision making (see chart for details).    Angela Shah is a 42 y.o. female who presents to ED from PCP for concerns of LLE swelling for a few days and intermittent sharp chest pain for over a week. On exam, patient is afebrile, hemodynamically stable with pedal edema to left lower extremity.  Normal cardiopulmonary examination.  She does have history of prior DVT.  She is not on anticoagulants.  Lower extremity ultrasound negative.   Troponin negative.  Labs reviewed and reassuring.  Heart score of 2.  Doubt ACS, dissection.  PE would be highly unlikely as well given negative LE ultrasound. Discussed reassuring findings with patient today. Evaluation does not show pathology that would require ongoing emergent intervention or inpatient treatment. Will have her follow up with her PCP for further discussion / management of symptoms. Reasons to return to ER discussed. All questions answered.    Final Clinical Impressions(s) / ED Diagnoses   Final diagnoses:  Leg swelling  Chest pain with low risk for cardiac etiology    ED Discharge Orders    None       Ward, Ozella Almond, PA-C 10/30/17 1936    Macarthur Critchley, MD 10/30/17 2354

## 2017-10-30 NOTE — ED Notes (Signed)
Pt in ultrasound at this time

## 2017-10-30 NOTE — Discharge Instructions (Signed)
It was my pleasure taking care of you today!   Fortunately, your labs and imaging today were reassuring.   Please follow up with your primary care doctor for further discussion of your symptoms.   Return to ER for new or worsening symptoms, any additional concerns.

## 2017-10-31 ENCOUNTER — Ambulatory Visit (INDEPENDENT_AMBULATORY_CARE_PROVIDER_SITE_OTHER): Payer: 59

## 2017-10-31 ENCOUNTER — Encounter: Payer: Self-pay | Admitting: Family Medicine

## 2017-10-31 VITALS — BP 120/89 | HR 80 | Ht 64.0 in | Wt 174.8 lb

## 2017-10-31 DIAGNOSIS — Z3042 Encounter for surveillance of injectable contraceptive: Secondary | ICD-10-CM | POA: Diagnosis not present

## 2017-10-31 MED ORDER — HYDROCHLOROTHIAZIDE 12.5 MG PO TABS
ORAL_TABLET | ORAL | 3 refills | Status: DC
Start: 2017-10-31 — End: 2023-01-31

## 2017-10-31 NOTE — Progress Notes (Signed)
I have reviewed the chart and agree with nursing staff's documentation of this patient's encounter.  Hansel Feinstein, CNM 10/31/2017 7:32 PM

## 2017-10-31 NOTE — Progress Notes (Signed)
Angela Shah here for Depo-Provera  Injection.  Injection administered without complication. Patient will return in two months  for next injection.  Kutter Schnepf l Berklee Battey, CMA 10/31/2017  8:15 AM

## 2017-11-01 ENCOUNTER — Encounter: Payer: Self-pay | Admitting: Family Medicine

## 2017-11-14 ENCOUNTER — Encounter: Payer: Self-pay | Admitting: Family Medicine

## 2017-12-22 IMAGING — US US PELVIS COMPLETE
1 series · 14 of 25 positions shown · non-contrast
Comparison: None

CLINICAL DATA: Dysmenorrhea for 4 months

EXAM:
TRANSABDOMINAL AND TRANSVAGINAL ULTRASOUND OF PELVIS
TECHNIQUE: Both transabdominal and transvaginal ultrasound examinations of the
pelvis were performed. Transabdominal technique was performed for
global imaging of the pelvis including uterus, ovaries, adnexal
regions, and pelvic cul-de-sac. It was necessary to proceed with
endovaginal exam following the transabdominal exam to visualize the
endometrium, uterus and ovaries.

[Series 1: us pelvis complete · 0.20mm/px · 14 of 81 slices shown]
[im 1/81]
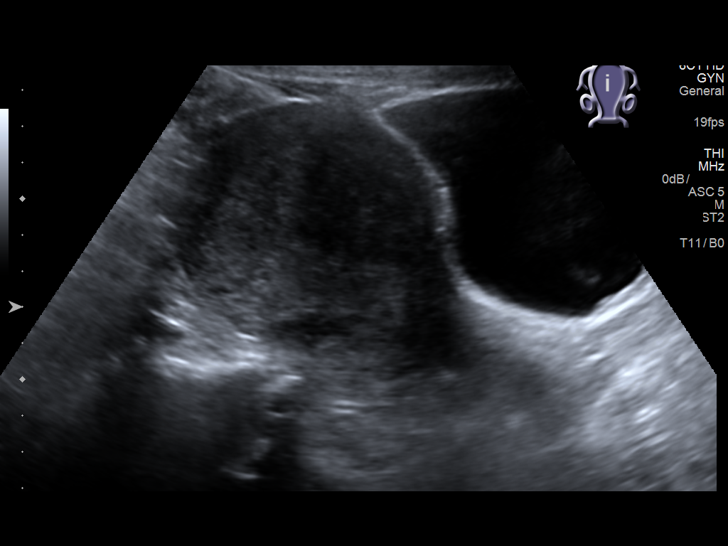
[im 7/81]
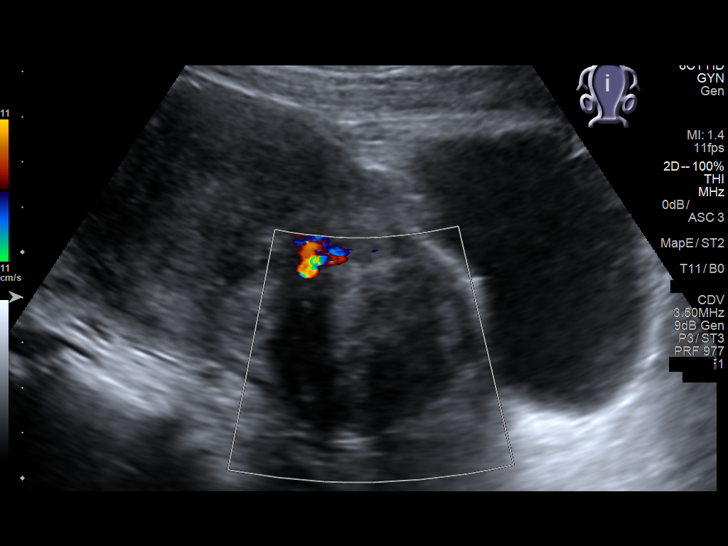
[im 14/81]
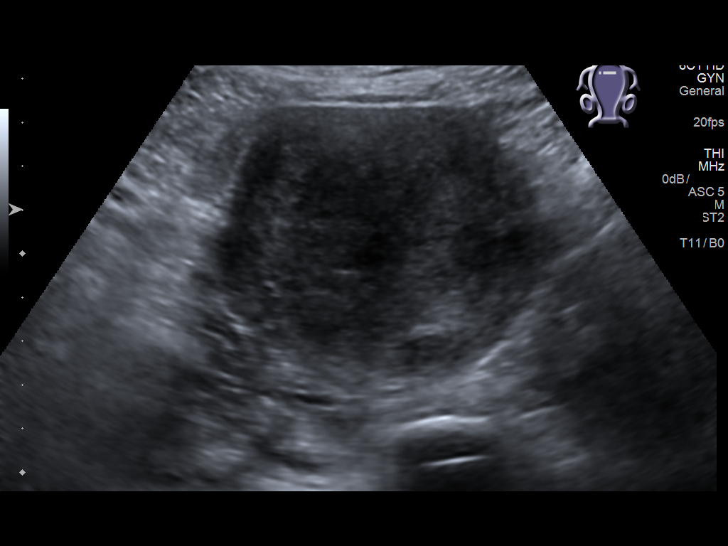
[im 21/81]
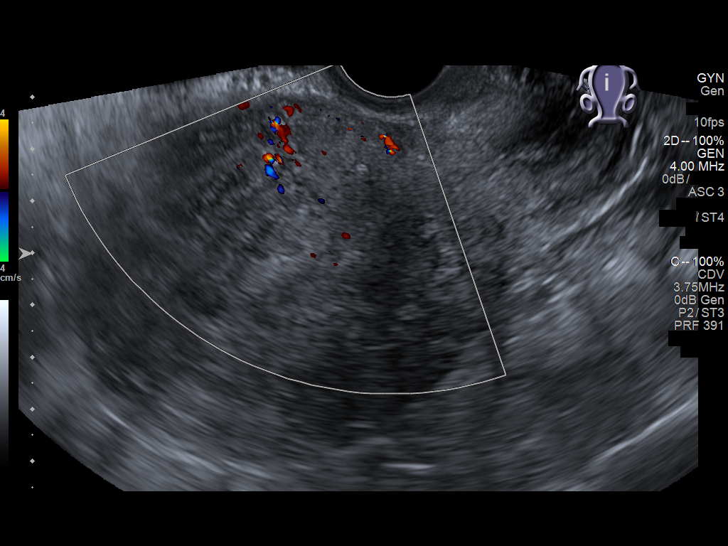
[im 27/81]
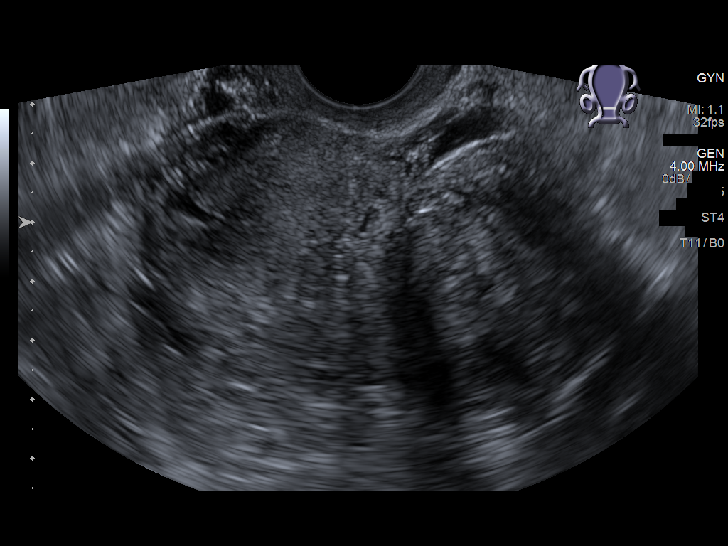
[im 31/81]
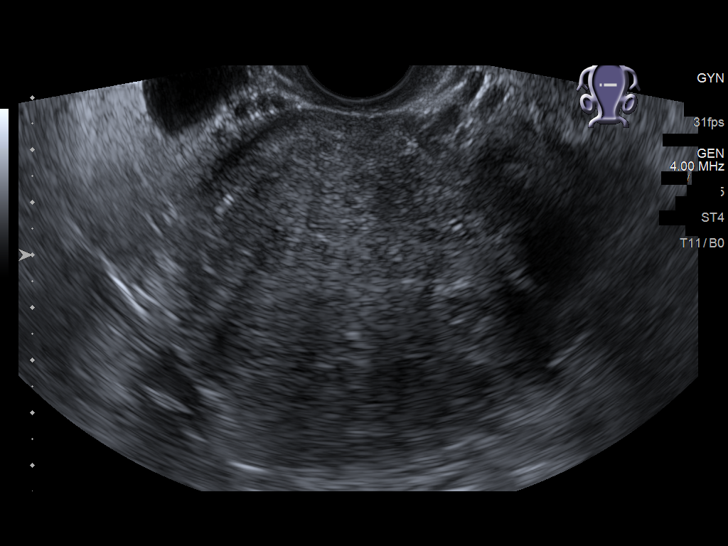
[im 37/81]
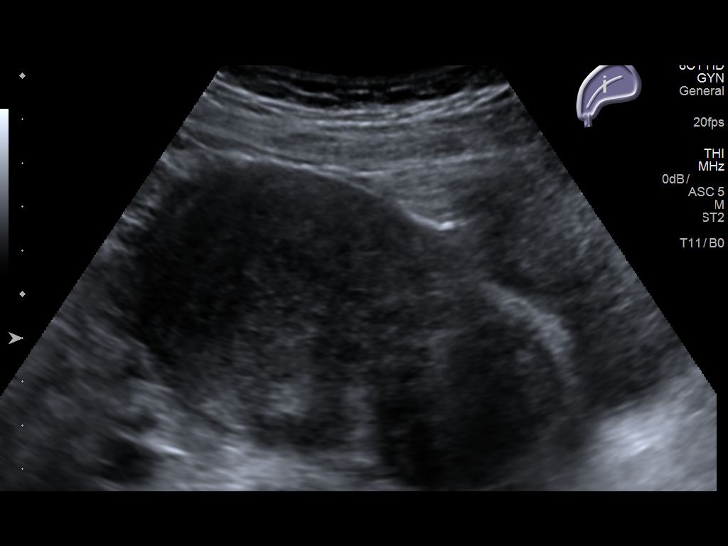
[im 44/81]
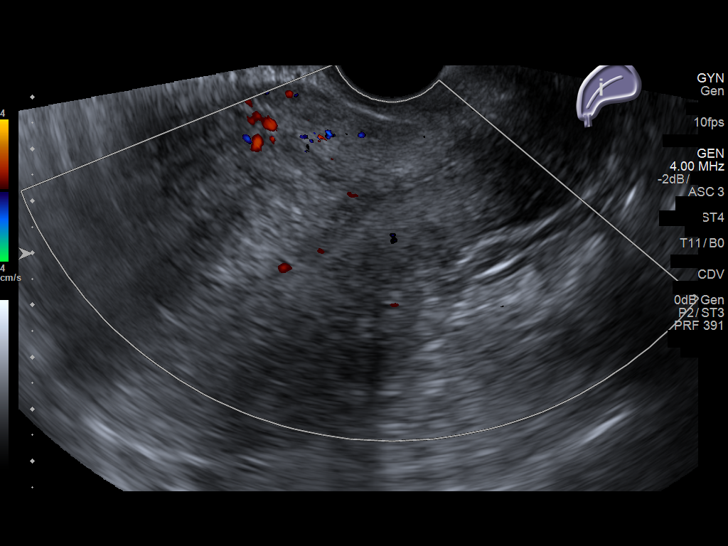
[im 51/81]
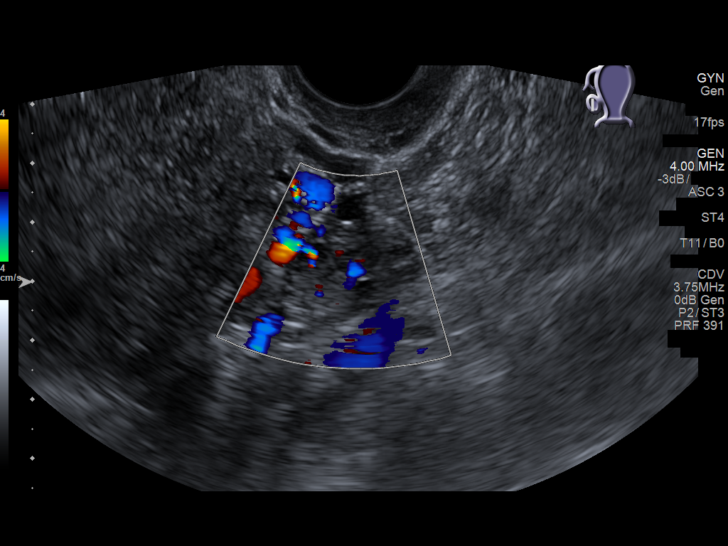
[im 54/81]
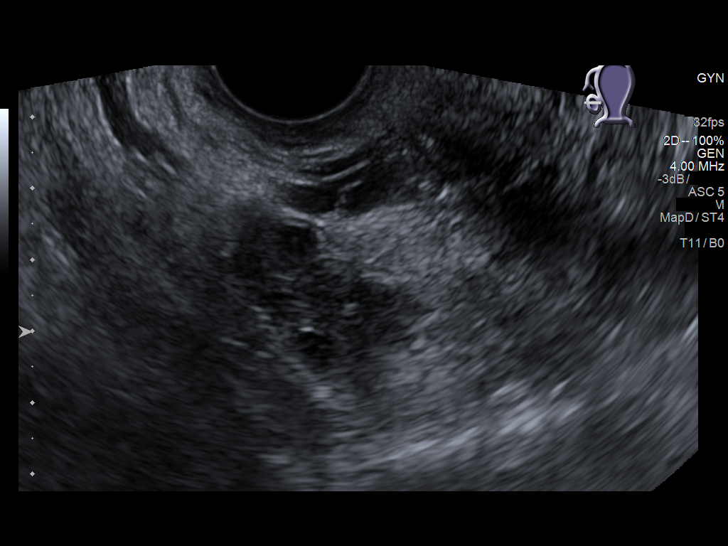
[im 61/81]
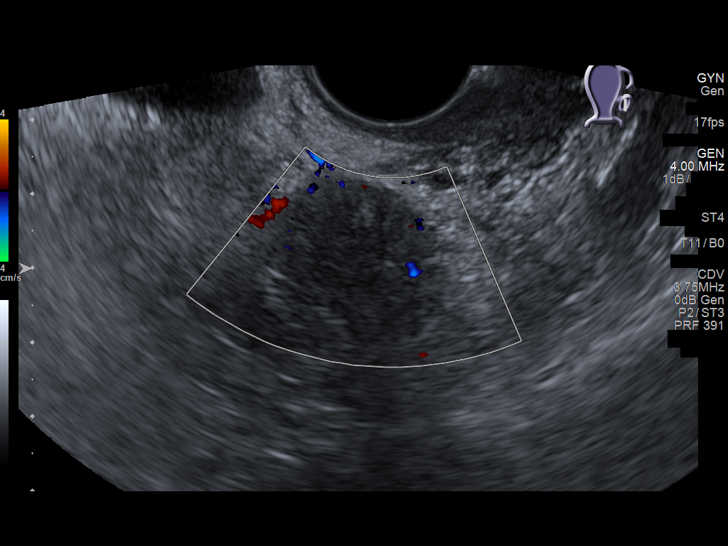
[im 67/81]
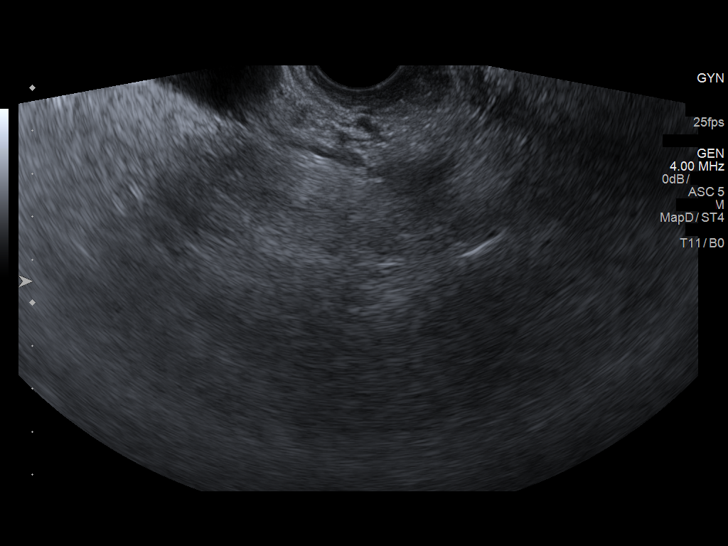
[im 74/81]
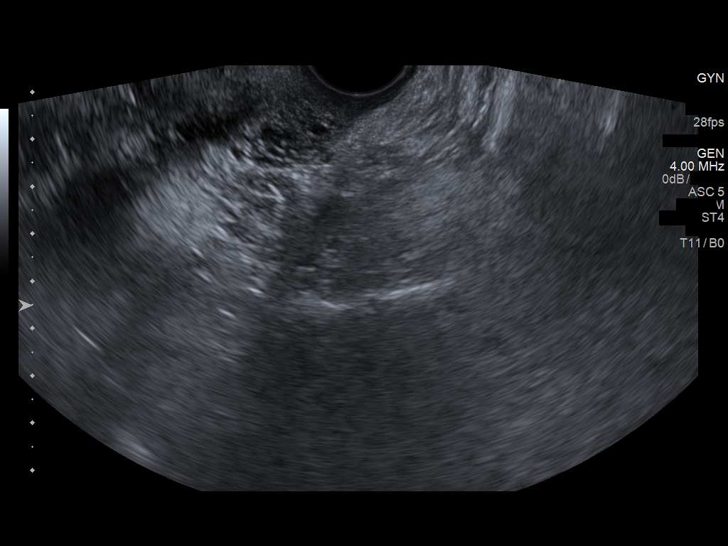
[im 81/81]
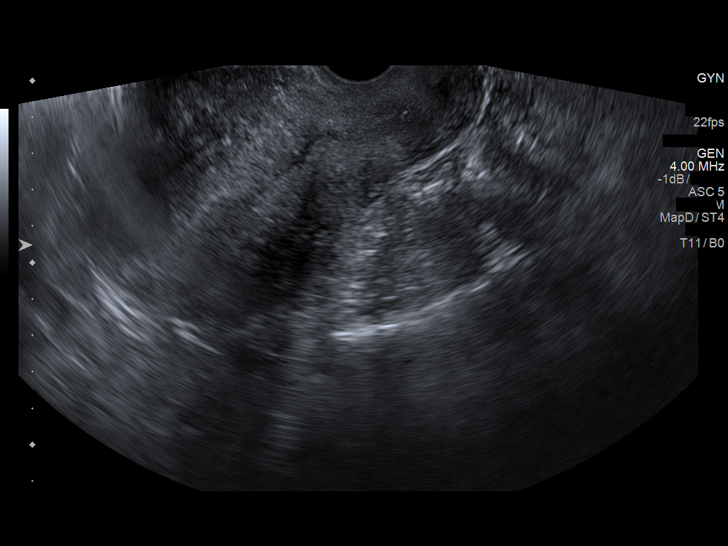

[14 of 25 positions shown; findings below may reference images not displayed]

FINDINGS: Uterus

Measurements: 11.2 x 7.0 x 6.3 cm. Large leiomyoma at uterine fundus
5.4 x 3.8 x 4.5 cm, subserosal and intramural. Additional large
leiomyoma at lower uterine segment on LEFT 5.0 x 4.6 x 4.0 cm.

Endometrium

Thickness: 12 mm thick.  No endometrial fluid or focal abnormality

Right ovary

Measurements: 2.7 x 1.7 x 1.8 cm. Normal morphology without mass

Left ovary

Measurements: 2.6 x 2.1 x 1.5 cm. Normal morphology without mass

Other findings

No free pelvic fluid or adnexal masses.

Assessment of the adnexa limited by bowel gas.
IMPRESSION: Two large leiomyomata within the uterus as above.

Unremarkable ovaries.

## 2018-04-23 ENCOUNTER — Other Ambulatory Visit: Payer: Self-pay

## 2018-04-23 MED ORDER — MEDROXYPROGESTERONE ACETATE 150 MG/ML IM SUSP: 150.0000 mg | INTRAMUSCULAR | 2 refills | Status: DC

## 2018-04-23 NOTE — Progress Notes (Signed)
Pt requested a refill of Depo Provera. Medication was e prescribed to pharmacy. Informed pt that she will need a UPT because her last Depo was 10/31/17. Pt states that she had a tubal. Per Dr. Maylene Roes- Tamala Julian, pt will need a UPT per protocol and until records are received.

## 2018-04-24 ENCOUNTER — Ambulatory Visit (INDEPENDENT_AMBULATORY_CARE_PROVIDER_SITE_OTHER): Payer: 59

## 2018-04-24 VITALS — BP 138/92 | HR 77 | Ht 64.0 in | Wt 173.0 lb

## 2018-04-24 DIAGNOSIS — Z3202 Encounter for pregnancy test, result negative: Secondary | ICD-10-CM | POA: Diagnosis not present

## 2018-04-24 DIAGNOSIS — Z3042 Encounter for surveillance of injectable contraceptive: Secondary | ICD-10-CM | POA: Diagnosis not present

## 2018-04-24 DIAGNOSIS — N920 Excessive and frequent menstruation with regular cycle: Secondary | ICD-10-CM

## 2018-04-24 LAB — POCT URINE PREGNANCY: PREG TEST UR: NEGATIVE

## 2018-04-24 MED ORDER — MEDROXYPROGESTERONE ACETATE 150 MG/ML IM SUSP
150.0000 mg | Freq: Once | INTRAMUSCULAR | Status: AC
  Administered 2018-04-24: 150 mg via INTRAMUSCULAR

## 2018-04-24 NOTE — Progress Notes (Addendum)
Patient gets depo provera for control of heavy periods. Patient UPT Negative. PATIENT PROVIDED DEPO PROVERA Patient to sign records release for [facility] where her tubal ligation was done. Kathrene Alu RN  Attestation of Attending Supervision of Advanced Practitioner (CNM/NP): Evaluation and management procedures were performed by the Advanced Practitioner under my supervision and collaboration.  I have reviewed the Advanced Practitioner's note and chart, and I agree with the management and plan.  Carolyn Harraway-Smith 10:54 AM

## 2018-06-10 DIAGNOSIS — M79601 Pain in right arm: Secondary | ICD-10-CM | POA: Diagnosis not present

## 2018-06-10 DIAGNOSIS — I8001 Phlebitis and thrombophlebitis of superficial vessels of right lower extremity: Secondary | ICD-10-CM | POA: Diagnosis not present

## 2018-06-10 DIAGNOSIS — I82611 Acute embolism and thrombosis of superficial veins of right upper extremity: Secondary | ICD-10-CM | POA: Diagnosis not present

## 2018-06-11 ENCOUNTER — Telehealth: Payer: Self-pay | Admitting: Family

## 2018-06-11 ENCOUNTER — Encounter: Payer: Self-pay | Admitting: Family Medicine

## 2018-06-11 ENCOUNTER — Ambulatory Visit (INDEPENDENT_AMBULATORY_CARE_PROVIDER_SITE_OTHER): Payer: 59 | Admitting: Family Medicine

## 2018-06-11 VITALS — BP 120/80 | HR 78 | Temp 98.3°F | Resp 16 | Ht 64.0 in | Wt 171.0 lb

## 2018-06-11 DIAGNOSIS — I809 Phlebitis and thrombophlebitis of unspecified site: Secondary | ICD-10-CM

## 2018-06-11 NOTE — Progress Notes (Signed)
[facility] [address], [place], [place] [address] [phone]  Date:  06/11/2018   Name:  Angela Shah   DOB:  1975-12-28   MRN:  [id number]  PCP:  Darreld Mclean, MD    Chief Complaint: ER follow up (yesterday-told to follow up with pcp) and Headache   History of Present Illness:  Angela Shah is a 43 y.o. very pleasant female patient who presents with the following: History of single occurrence of DVT in 2011-she does not recall having a hypercoagulation work-up  following up from the ER today - she was seen yesterday with right arm swelling and pain There was concern about DVT but her [place] was negative.  See copied ER records below:   43 year old female presents with right upper extremity pain. Patient has history of 1 DVT in the left lower extremity that was unprovoked. Unsure if patient has had hypercoagulability work-up. She used to be on warfarin but does not take this anymore. She complains of right0 upper arm pain medially and a palpable cord. Patient has a palpable cord on exam on the right medial upper arm. She is a 2+ right radial pulse. There is no significant right upper extremity swelling. Patient's neurological exam is normal. Duplex ultrasound the right upper extremity does not show deep vein thrombosis. It does show superficial thrombophlebitis. Patient was instructed to elevate her right upper extremity and apply warm compresses. She will be prescribed naproxen and NSAID twice daily for a week. She was instructed that she needs repeat ultrasound in 1 to 2 weeks to ensure thrombus resolution and to exclude thrombus propagation. ED return precautions follow-up with primary doctor. [place] as follows;  Impressions Right Impression There is no sonographic evidence of DVT noted in the RIGHT upper extremity. In a small superficial vein in the RIGHT upper medial arm, there is a thrombus with surrounding  inflammatory changes. Left Impression There is no sonographic evidence of DVT noted in the Left upper extremity at the level of the IJV.  She notes that the affected area of her upper arm still hurts, she is taking her naproxen (she was rx 500 mg BID) and using warm compresses  She also notes that her right hand will go numb intermittently over the last week just and she developed a clot The numbness will come and go, seems to resolve when she flexes her hand  Her mother had history of DVT, and several other family members had the same She has never had a miscarriage Patient Active Problem List   Diagnosis Date Noted  . Fibroids, subserous 08/21/2017  . History of DVT (deep vein thrombosis) 08/21/2017  . Dysmenorrhea 09/12/2016    Past Medical History:  Diagnosis Date  . DVT (deep venous thrombosis) ([facility])     Past Surgical History:  Procedure Laterality Date  . Ring tubal ligation      Social History   Tobacco Use  . Smoking status: Never Smoker  . Smokeless tobacco: Never Used  Substance Use Topics  . Alcohol use: No  . Drug use: No    Family History  Problem Relation Age of Onset  . Heart disease Mother   . Cancer Sister        Ovarian cancer  . Cancer Maternal Grandmother        Lung cancer    No Known Allergies  Medication list has been reviewed and updated.  Current Outpatient Medications on  File Prior to Visit  Medication Sig Dispense Refill  . ibuprofen (ADVIL,MOTRIN) 800 MG tablet Take 800 mg by mouth every 8 (eight) hours as needed.    . medroxyPROGESTERone (DEPO-PROVERA) 150 MG/ML injection Inject 1 mL (150 mg total) into the muscle every 3 (three) months. 1 mL 2  . megestrol (MEGACE) 40 MG tablet Take 2 tablets (80 mg total) by mouth 2 (two) times daily. Can increase to two tablets twice a day in the event of heavy bleeding 120 tablet 5  . hydrochlorothiazide (HYDRODIURIL) 12.5 MG tablet Take 1 daily as needed for leg swelling- use just when needed  (Patient not taking: Reported on 06/11/2018) 30 tablet 3   Current Facility-Administered Medications on File Prior to Visit  Medication Dose Route Frequency Provider Last Rate Last Dose  . medroxyPROGESTERone (DEPO-PROVERA) injection 150 mg  150 mg Intramuscular Q90 days Truett Mainland, DO   150 mg at 10/31/17 0820    Review of Systems:  As per HPI- otherwise negative. No shortness of breath No calf symptoms No recent immobility, or long trips  Physical Examination: Vitals:   06/11/18 0932  BP: 120/80  Pulse: 78  Resp: 16  Temp: 98.3 F (36.8 C)  SpO2: 98%   Vitals:   06/11/18 0932  Weight: 171 lb (77.6 kg)  Height: 5\' 4"  (1.626 m)   Body mass index is 29.35 kg/m. Ideal Body Weight: Weight in (lb) to have BMI = 25: 145.3  GEN: WDWN, NAD, Non-toxic, A & O x 3, overweight, looks well HEENT: Atraumatic, Normocephalic. Neck supple. No masses, No LAD. Ears and Nose: No external deformity. CV: RRR, No M/G/R. No JVD. No thrill. No extra heart sounds. PULM: CTA B, no wheezes, crackles, rhonchi. No retractions. No resp. distress. No accessory muscle use. EXTR: No c/c/e NEURO Normal gait.  PSYCH: Normally interactive. Conversant. Not depressed or anxious appearing.  Calm demeanor.  The inner aspect of the right upper arm displays a slightly tender cord, which corresponds with known thrombophlebitis She has normal strength, sensation and DTR of all limbs Hand is NV intact and not "numb" at the time of our visit    Assessment and Plan: Thrombophlebitis - Plan: Ambulatory referral to Hematology, VAS [place] UPPER EXTREMITY VENOUS DUPLEX  Following up from outside ER for superficial thrombophlebitis seen at [place] yesterday  She is on NSAIDs and using warm compresses No other complications noted She will have a repeat US in one week- ordered today She reports several family members with DVT history; will set up for a hypercoag evaluation with hematology  Her hand numbness is likely  due to the clot in some way. The hand is certainly normal today, warm and well perfused with normal strength and sensation  Asked her to let me know if her intermittent numbness is worsening and she agrees   Signed Lamar Blinks, MD

## 2018-06-11 NOTE — Patient Instructions (Signed)
We will set you up to see hematology for evaluation of a possible blood clotting disorder.  I suspect that you may have a familiar clotting disorder given all the blood clots in your family! For the time being continue taking the naproxen as you were prescribed and use the warm compresses  I will set you up for a repeat right arm Korea in one week

## 2018-06-11 NOTE — Telephone Encounter (Signed)
Spoke with patient to confirm new patient appt 06/18/18 at 130 pm. Patient aware of location/date/time

## 2018-06-15 ENCOUNTER — Other Ambulatory Visit: Payer: Self-pay | Admitting: Family

## 2018-06-15 DIAGNOSIS — I808 Phlebitis and thrombophlebitis of other sites: Secondary | ICD-10-CM

## 2018-06-15 DIAGNOSIS — Z86718 Personal history of other venous thrombosis and embolism: Secondary | ICD-10-CM

## 2018-06-18 ENCOUNTER — Inpatient Hospital Stay: Payer: 59

## 2018-06-18 ENCOUNTER — Inpatient Hospital Stay: Payer: 59 | Attending: Family | Admitting: Family

## 2018-06-18 ENCOUNTER — Other Ambulatory Visit: Payer: Self-pay

## 2018-06-18 ENCOUNTER — Encounter: Payer: Self-pay | Admitting: Family

## 2018-06-18 VITALS — BP 133/91 | HR 81 | Temp 98.4°F | Resp 18 | Ht 64.0 in | Wt 173.0 lb

## 2018-06-18 DIAGNOSIS — I808 Phlebitis and thrombophlebitis of other sites: Secondary | ICD-10-CM | POA: Insufficient documentation

## 2018-06-18 DIAGNOSIS — Z7901 Long term (current) use of anticoagulants: Secondary | ICD-10-CM | POA: Diagnosis not present

## 2018-06-18 DIAGNOSIS — Z86718 Personal history of other venous thrombosis and embolism: Secondary | ICD-10-CM | POA: Diagnosis not present

## 2018-06-18 DIAGNOSIS — D509 Iron deficiency anemia, unspecified: Secondary | ICD-10-CM | POA: Diagnosis not present

## 2018-06-18 LAB — CBC WITH DIFFERENTIAL (CANCER CENTER ONLY)
Abs Immature Granulocytes: 0.02 10*3/uL (ref 0.00–0.07)
Basophils Absolute: 0 10*3/uL (ref 0.0–0.1)
Basophils Relative: 0 %
Eosinophils Absolute: 0.1 10*3/uL (ref 0.0–0.5)
Eosinophils Relative: 1 %
HCT: 40.2 % (ref 36.0–46.0)
Hemoglobin: 12.8 g/dL (ref 12.0–15.0)
Immature Granulocytes: 0 %
Lymphocytes Relative: 45 %
Lymphs Abs: 3 10*3/uL (ref 0.7–4.0)
MCH: 24 pg — ABNORMAL LOW (ref 26.0–34.0)
MCHC: 31.8 g/dL (ref 30.0–36.0)
MCV: 75.3 fL — AB (ref 80.0–100.0)
Monocytes Absolute: 0.4 10*3/uL (ref 0.1–1.0)
Monocytes Relative: 6 %
Neutro Abs: 3.1 10*3/uL (ref 1.7–7.7)
Neutrophils Relative %: 48 %
PLATELETS: 276 10*3/uL (ref 150–400)
RBC: 5.34 MIL/uL — ABNORMAL HIGH (ref 3.87–5.11)
RDW: 14.9 % (ref 11.5–15.5)
WBC Count: 6.6 10*3/uL (ref 4.0–10.5)
nRBC: 0 % (ref 0.0–0.2)

## 2018-06-18 LAB — CMP (CANCER CENTER ONLY)
ALT: 12 U/L (ref 0–44)
AST: 11 U/L — ABNORMAL LOW (ref 15–41)
Albumin: 4 g/dL (ref 3.5–5.0)
Alkaline Phosphatase: 79 U/L (ref 38–126)
Anion gap: 6 (ref 5–15)
BUN: 13 mg/dL (ref 6–20)
CO2: 24 mmol/L (ref 22–32)
Calcium: 9.3 mg/dL (ref 8.9–10.3)
Chloride: 107 mmol/L (ref 98–111)
Creatinine: 0.86 mg/dL (ref 0.44–1.00)
GFR, Est AFR Am: >60 mL/min
GFR, Estimated: >60 mL/min
Glucose, Bld: 100 mg/dL — ABNORMAL HIGH (ref 70–99)
Potassium: 3.4 mmol/L — ABNORMAL LOW (ref 3.5–5.1)
Sodium: 137 mmol/L (ref 135–145)
Total Bilirubin: 0.3 mg/dL (ref 0.3–1.2)
Total Protein: 6.6 g/dL (ref 6.5–8.1)

## 2018-06-18 LAB — RETICULOCYTES
Immature Retic Fract: 11 % (ref 2.3–15.9)
RBC.: 5.34 MIL/uL — ABNORMAL HIGH (ref 3.87–5.11)
Retic Count, Absolute: 82.2 10*3/uL (ref 19.0–186.0)
Retic Ct Pct: 1.5 % (ref 0.4–3.1)

## 2018-06-18 LAB — SAVE SMEAR(SSMR), FOR PROVIDER SLIDE REVIEW

## 2018-06-18 LAB — LACTATE DEHYDROGENASE: LDH: 216 U/L — ABNORMAL HIGH (ref 98–192)

## 2018-06-18 MED ORDER — RIVAROXABAN 20 MG PO TABS: 20.0000 mg | ORAL_TABLET | Freq: Every day | ORAL | 2 refills | Status: DC

## 2018-06-18 NOTE — Progress Notes (Signed)
Hematology/Oncology Consultation   Name: Angela Shah      MRN: [id number]    Location: Room/bed info not found  Date: 06/18/2018 Time:3:10 PM   REFERRING PHYSICIAN: Lamar Blinks, MD  REASON FOR CONSULT: Thrombophlebitis   DIAGNOSIS:  History DVT of popliteal vein in left lower extremity Superficial thrombus of the right upper medial arm  HISTORY OF PRESENT ILLNESS: Angela Shah is a very pleasant 43 yo [place] American female with history of DVT in the left lower extremity diagnosed in 2011. She was treated with 9 months of coumadin and states that the clot resolved.  She had not been travelling, ill or on birthcontrol at the time.  She developed pain, numbness and tingling in the right upper extremity several weeks ago and was found to have a superficial thrombus of the right upper medial arm. She is currently only ibuprofen only. She is on Depo and Megace for fibroids and history of heavy cycles.  She has 4 children and no history of miscarriage.  She still has the pain in her right arm as well as intermittent numbness and tingling in her arm and hand.  She flew to [facility] in December.  She has significant family history of thrombus: mother - DVT of a lower extremity, maternal aunt - PE and maternal first cousin - DVT that led to stroke.  She has some post phlebitic pain and swelling in the left lower extremity at times. She wears compression stockings regularly for support.  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 She is exposed to chemicals regularly and wears a respirator.  She has history of iron deficiency anemia and occasionally has chills and palpitations. She is unable to tolerate oral iron due to constipation and GI upset.  She has history of vertigo and episodes of dizziness with the occasional headache.  No fever, n/v, cough, rash, SOB, chest pain, abdominal pain or changes in bowel or bladder habits.  No lymphadenopathy noted on exam.  No episodes of  bleeding, no bruising or petechiae.  She does not smoke or drink alcoholic beverages.  She does not work out or take any kind of supplements.   ROS: All other 10 point review of systems is negative.   PAST MEDICAL HISTORY:   Past Medical History:  Diagnosis Date  . DVT (deep venous thrombosis) ([facility])     ALLERGIES: No Known Allergies    MEDICATIONS:  Current Outpatient Medications on File Prior to Visit  Medication Sig Dispense Refill  . hydrochlorothiazide (HYDRODIURIL) 12.5 MG tablet Take 1 daily as needed for leg swelling- use just when needed 30 tablet 3  . ibuprofen (ADVIL,MOTRIN) 800 MG tablet Take 800 mg by mouth every 8 (eight) hours as needed.    . medroxyPROGESTERone (DEPO-PROVERA) 150 MG/ML injection Inject 1 mL (150 mg total) into the muscle every 3 (three) months. 1 mL 2  . megestrol (MEGACE) 40 MG tablet Take 2 tablets (80 mg total) by mouth 2 (two) times daily. Can increase to two tablets twice a day in the event of heavy bleeding 120 tablet 5   Current Facility-Administered Medications on File Prior to Visit  Medication Dose Route Frequency Provider Last Rate Last Dose  . medroxyPROGESTERone (DEPO-PROVERA) injection 150 mg  150 mg Intramuscular Q90 days Truett Mainland, DO   150 mg at 10/31/17 0820     PAST SURGICAL HISTORY Past Surgical History:  Procedure Laterality Date  . Ring tubal ligation  FAMILY HISTORY: Family History  Problem Relation Age of Onset  . Heart disease Mother   . Cancer Sister        Ovarian cancer  . Cancer Maternal Grandmother        Lung cancer    SOCIAL HISTORY:  reports that she has never smoked. She has never used smokeless tobacco. She reports that she does not drink alcohol or use drugs.  PERFORMANCE STATUS: The patient's performance status is 1 - Symptomatic but completely ambulatory  PHYSICAL EXAM: Most Recent Vital Signs: Blood pressure (!) 133/91, pulse 81, temperature 98.4 F (36.9 C), temperature source Oral,  resp. rate 18, height 5\' 4"  (1.626 m), weight 173 lb (78.5 kg), SpO2 100 %. BP (!) 133/91 (BP Location: Left Arm, Patient Position: Sitting)   Pulse 81   Temp 98.4 F (36.9 C) (Oral)   Resp 18   Ht 5\' 4"  (1.626 m)   Wt 173 lb (78.5 kg)   SpO2 100%   BMI 29.70 kg/m   General Appearance:    Alert, cooperative, no distress, appears stated age  Head:    Normocephalic, without obvious abnormality, atraumatic  Eyes:    PERRL, conjunctiva/corneas clear, EOM's intact, fundi    benign, both eyes        Throat:   Lips, mucosa, and tongue normal; teeth and gums normal  Neck:   Supple, symmetrical, trachea midline, no adenopathy;    thyroid:  no enlargement/tenderness/nodules; no carotid   bruit or JVD  Back:     Symmetric, no curvature, ROM normal, no CVA tenderness  Lungs:     Clear to auscultation bilaterally, respirations unlabored  Chest Wall:    No tenderness or deformity   Heart:    Regular rate and rhythm, S1 and S2 normal, no murmur, rub   or gallop     Abdomen:     Soft, non-tender, bowel sounds active all four quadrants,    no masses, no organomegaly        Extremities:   Extremities normal, atraumatic, no cyanosis or edema  Pulses:   2+ and symmetric all extremities  Skin:   Skin color, texture, turgor normal, no rashes or lesions  Lymph nodes:   Cervical, supraclavicular, and axillary nodes normal  Neurologic:   CNII-XII intact, normal strength, sensation and reflexes    throughout    LABORATORY DATA:  Results for orders placed or performed in visit on 06/18/18 (from the past 48 hour(s))  CBC with Differential (Cancer Center Only)     Status: Abnormal   Collection Time: 06/18/18  1:22 PM  Result Value Ref Range   WBC Count 6.6 4.0 - 10.5 K/uL   RBC 5.34 (H) 3.87 - 5.11 MIL/uL   Hemoglobin 12.8 12.0 - 15.0 g/dL   HCT 40.2 36.0 - 46.0 %   MCV 75.3 (L) 80.0 - 100.0 fL   MCH 24.0 (L) 26.0 - 34.0 pg   MCHC 31.8 30.0 - 36.0 g/dL   RDW 14.9 11.5 - 15.5 %   Platelet Count  276 150 - 400 K/uL   nRBC 0.0 0.0 - 0.2 %   Neutrophils Relative % 48 %   Neutro Abs 3.1 1.7 - 7.7 K/uL   Lymphocytes Relative 45 %   Lymphs Abs 3.0 0.7 - 4.0 K/uL   Monocytes Relative 6 %   Monocytes Absolute 0.4 0.1 - 1.0 K/uL   Eosinophils Relative 1 %   Eosinophils Absolute 0.1 0.0 - 0.5 K/uL   Basophils  Relative 0 %   Basophils Absolute 0.0 0.0 - 0.1 K/uL   Immature Granulocytes 0 %   Abs Immature Granulocytes 0.02 0.00 - 0.07 K/uL    Comment: Performed at [facility] at [facility], [address], [place], [place] [address]  Save Smear [facility])     Status: None   Collection Time: 06/18/18  1:22 PM  Result Value Ref Range   Smear Review SMEAR STAINED AND AVAILABLE FOR REVIEW     Comment: Performed at [facility] at [facility], [address] [place], [place] [address]  CMP ([place] only)     Status: Abnormal   Collection Time: 06/18/18  1:22 PM  Result Value Ref Range   Sodium 137 135 - 145 mmol/L   Potassium 3.4 (L) 3.5 - 5.1 mmol/L   Chloride 107 98 - 111 mmol/L   CO2 24 22 - 32 mmol/L   Glucose, Bld 100 (H) 70 - 99 mg/dL   BUN 13 6 - 20 mg/dL   Creatinine 0.86 0.44 - 1.00 mg/dL   Calcium 9.3 8.9 - 10.3 mg/dL   Total Protein 6.6 6.5 - 8.1 g/dL   Albumin 4.0 3.5 - 5.0 g/dL   AST 11 (L) 15 - 41 U/L   ALT 12 0 - 44 U/L   Alkaline Phosphatase 79 38 - 126 U/L   Total Bilirubin 0.3 0.3 - 1.2 mg/dL   GFR, Est Non Af Am >60 >60 mL/min   GFR, Est AFR Am >60 >60 mL/min   Anion gap 6 5 - 15    Comment: Performed at [facility] at [facility], [address] [place], [place] [address]  Reticulocytes     Status: Abnormal   Collection Time: 06/18/18  1:22 PM  Result Value Ref Range   Retic Ct Pct 1.5 0.4 - 3.1 %   RBC. 5.34 (H) 3.87 - 5.11 MIL/uL   Retic Count, Absolute 82.2 19.0 - 186.0 K/uL   Immature Retic Fract 11.0 2.3 - 15.9 %    Comment: Performed at [facility]  Lab at [facility], [address], [place], [place] [address]      RADIOGRAPHY: No results found.     PATHOLOGY: None  ASSESSMENT/PLAN: Ms. Darrow is a very pleasant 43 yo African American female with history of DVT in the left lower extremity diagnosed in 2011 treated with 9 months of Coumadin and now a superficial thrombus of the right upper medial arm. She has a strong family history of thrombus on her mother's side.  She is having pain, numbness and tingling in the right extremity. Radial pulse is 2+.  Hypercoag panel is pending.  She will stop her Megace due to it known cause of thrombus.  We will get her started on Xarelto 20 mg PO daily. Prescription sent.  We will move her [place] from 2/6 to same day as follow-up in 6 weeks.   All questions were answered and she verbalized understanding. She will contact our office with any questions or concerns. We can certainly see her sooner if need be.   She was discussed with and also seen by Dr. Marin Olp and he is in agreement with the aforementioned.   Laverna Peace     Addendum:   I saw and examined Ms. Mateo Flow with Judson Roch.  I agree with her above assessment.  She clearly has to stop the  Megace.  This is a known prothrombotic agent.  I do believe that she does need formal anticoagulation.  I believe that Xarelto would be appropriate.  I would keep her on Xarelto for 6 weeks.  I think this would be helpful.  We will have to see what her thrombophilic studies look like.  I might sure there is a thrombophilic condition here.  She does have some iron deficiency.  Her iron studies showed a ferritin of 12 with an iron saturation of 17%.  We will get her on some oral iron.  If she not take oral iron, we can see about IV iron.  I would like to get another Doppler of her right arm.  I would like to get this in 6 weeks when we see her back.  We spent about 40 minutes with Ms. Mateo Flow.  We spent all the time with her.  All the time  spent face-to-face.  We answered her questions.  We reviewed our recommendations.  Lattie Haw, MD

## 2018-06-19 ENCOUNTER — Telehealth: Payer: Self-pay | Admitting: Family

## 2018-06-19 LAB — IRON AND TIBC
Iron: 55 ug/dL (ref 41–142)
Saturation Ratios: 17 % — ABNORMAL LOW (ref 21–57)
TIBC: 324 ug/dL (ref 236–444)
UIBC: 269 ug/dL (ref 120–384)

## 2018-06-19 LAB — ANTITHROMBIN III: AntiThromb III Func: 108 % (ref 75–120)

## 2018-06-19 LAB — ERYTHROPOIETIN: Erythropoietin: 16.5 m[IU]/mL (ref 2.6–18.5)

## 2018-06-19 LAB — HOMOCYSTEINE: Homocysteine: 7.6 umol/L (ref 0.0–14.5)

## 2018-06-19 LAB — FERRITIN: FERRITIN: 12 ng/mL (ref 11–307)

## 2018-06-19 NOTE — Telephone Encounter (Signed)
Called and spoke with patient regarding appointment dates changed/ Calendar/letter mailed to patient per 2/3 los

## 2018-06-20 ENCOUNTER — Encounter: Payer: Self-pay | Admitting: Family

## 2018-06-20 LAB — PROTEIN S, TOTAL: Protein S Ag, Total: 99 % (ref 60–150)

## 2018-06-20 LAB — CARDIOLIPIN ANTIBODIES, IGG, IGM, IGA
Anticardiolipin IgA: <9 APL U/mL (ref 0–11)
Anticardiolipin IgG: <9 GPL U/mL (ref 0–14)
Anticardiolipin IgM: <9 MPL U/mL (ref 0–12)

## 2018-06-20 LAB — LUPUS ANTICOAGULANT PANEL
DRVVT: 41.3 s (ref 0.0–47.0)
PTT Lupus Anticoagulant: 41 s (ref 0.0–51.9)

## 2018-06-20 LAB — PROTEIN C, TOTAL: Protein C, Total: 115 % (ref 60–150)

## 2018-06-20 LAB — PROTEIN C ACTIVITY: Protein C Activity: 129 % (ref 73–180)

## 2018-06-20 LAB — BETA-2-GLYCOPROTEIN I ABS, IGG/M/A
Beta-2 Glyco I IgG: <9 GPI IgG units (ref 0–20)
Beta-2-Glycoprotein I IgM: <9 GPI IgM units (ref 0–32)

## 2018-06-20 LAB — PROTEIN S ACTIVITY: Protein S Activity: 90 % (ref 63–140)

## 2018-06-21 ENCOUNTER — Ambulatory Visit (HOSPITAL_BASED_OUTPATIENT_CLINIC_OR_DEPARTMENT_OTHER): Payer: 59

## 2018-06-21 LAB — PROTHROMBIN GENE MUTATION

## 2018-06-21 LAB — FACTOR 5 LEIDEN

## 2018-06-23 DIAGNOSIS — G43001 Migraine without aura, not intractable, with status migrainosus: Secondary | ICD-10-CM | POA: Diagnosis not present

## 2018-06-23 DIAGNOSIS — R51 Headache: Secondary | ICD-10-CM | POA: Diagnosis not present

## 2018-06-23 DIAGNOSIS — R112 Nausea with vomiting, unspecified: Secondary | ICD-10-CM | POA: Diagnosis not present

## 2018-06-26 ENCOUNTER — Encounter: Payer: Self-pay | Admitting: Family

## 2018-06-26 ENCOUNTER — Other Ambulatory Visit: Payer: Self-pay | Admitting: Family

## 2018-06-26 ENCOUNTER — Telehealth: Payer: Self-pay | Admitting: Pharmacist

## 2018-06-26 DIAGNOSIS — Z86718 Personal history of other venous thrombosis and embolism: Secondary | ICD-10-CM

## 2018-06-26 DIAGNOSIS — I808 Phlebitis and thrombophlebitis of other sites: Secondary | ICD-10-CM

## 2018-06-26 MED ORDER — APIXABAN 5 MG PO TABS: 5.0000 mg | ORAL_TABLET | Freq: Two times a day (BID) | ORAL | 2 refills | Status: DC

## 2018-06-26 NOTE — Progress Notes (Signed)
I spoke with Angela Shah today after receiving a message from her on MyChart and she states that she went to an outside ED last week for migraine and rash that started after she began taking Xarelto. Once she stopped the Xarelto on Thursday (06/21/2018) her symptoms resolved.  We will DC the Xarelto and switch her over to Eliquis. She is in agreement with the plan We have some samples for her to try and see if she is able to tolerate. She will pick these up tomorrow morning on her way to work.  She is doing well and is no distress at this time.

## 2018-06-26 NOTE — Telephone Encounter (Signed)
Samples given:  Eliquis 5 mg     2 Boxes x 14 tablets each Lot:  [id number]  Exp:  8/21

## 2018-06-29 ENCOUNTER — Telehealth: Payer: Self-pay | Admitting: *Deleted

## 2018-06-29 NOTE — Telephone Encounter (Addendum)
Patient is aware of results and recommendation   ----- Message from Eliezer Bottom, NP sent at 06/29/2018  9:31 AM EST ----- Iron is slightly low, she can take gentle iron over the counter to help boost this up. Thank you!!!  Sarah  ----- Message ----- From: Buel Ream, Lab In [place] Sent: 06/18/2018   1:51 PM EST To: Eliezer Bottom, NP

## 2018-07-06 DIAGNOSIS — R079 Chest pain, unspecified: Secondary | ICD-10-CM | POA: Diagnosis not present

## 2018-07-06 DIAGNOSIS — R1010 Upper abdominal pain, unspecified: Secondary | ICD-10-CM | POA: Diagnosis not present

## 2018-07-06 DIAGNOSIS — I2109 ST elevation (STEMI) myocardial infarction involving other coronary artery of anterior wall: Secondary | ICD-10-CM | POA: Diagnosis not present

## 2018-07-06 DIAGNOSIS — R0789 Other chest pain: Secondary | ICD-10-CM | POA: Diagnosis not present

## 2018-07-06 DIAGNOSIS — R000 Tachycardia, unspecified: Secondary | ICD-10-CM | POA: Diagnosis not present

## 2018-07-06 DIAGNOSIS — R0600 Dyspnea, unspecified: Secondary | ICD-10-CM | POA: Diagnosis not present

## 2018-07-06 DIAGNOSIS — A084 Viral intestinal infection, unspecified: Secondary | ICD-10-CM | POA: Diagnosis not present

## 2018-07-06 DIAGNOSIS — R112 Nausea with vomiting, unspecified: Secondary | ICD-10-CM | POA: Diagnosis not present

## 2018-07-10 ENCOUNTER — Other Ambulatory Visit: Payer: Self-pay

## 2018-07-10 ENCOUNTER — Ambulatory Visit (INDEPENDENT_AMBULATORY_CARE_PROVIDER_SITE_OTHER): Payer: 59

## 2018-07-10 VITALS — BP 118/76 | HR 87 | Ht 64.0 in | Wt 169.0 lb

## 2018-07-10 DIAGNOSIS — Z3042 Encounter for surveillance of injectable contraceptive: Secondary | ICD-10-CM | POA: Diagnosis not present

## 2018-07-10 DIAGNOSIS — N939 Abnormal uterine and vaginal bleeding, unspecified: Secondary | ICD-10-CM

## 2018-07-10 MED ORDER — MEDROXYPROGESTERONE ACETATE 150 MG/ML IM SUSP: 150.0000 mg | INTRAMUSCULAR | 2 refills | Status: DC

## 2018-07-10 NOTE — Progress Notes (Signed)
Fara Chute here for Depo-Provera  Injection.  Injection administered without complication. Patient will return in 2 months  for next injection.  Morgin Halls l Aleyna Cueva, CMA 07/10/2018  2:39 PM

## 2018-07-11 ENCOUNTER — Encounter: Payer: Self-pay | Admitting: Cardiology

## 2018-07-11 ENCOUNTER — Ambulatory Visit (INDEPENDENT_AMBULATORY_CARE_PROVIDER_SITE_OTHER): Payer: 59 | Admitting: Cardiology

## 2018-07-11 VITALS — BP 120/74 | HR 88 | Ht 64.0 in | Wt 171.1 lb

## 2018-07-11 DIAGNOSIS — I809 Phlebitis and thrombophlebitis of unspecified site: Secondary | ICD-10-CM | POA: Insufficient documentation

## 2018-07-11 DIAGNOSIS — R9431 Abnormal electrocardiogram [ECG] [EKG]: Secondary | ICD-10-CM | POA: Diagnosis not present

## 2018-07-11 DIAGNOSIS — R0789 Other chest pain: Secondary | ICD-10-CM

## 2018-07-11 DIAGNOSIS — Z86718 Personal history of other venous thrombosis and embolism: Secondary | ICD-10-CM | POA: Diagnosis not present

## 2018-07-11 DIAGNOSIS — R071 Chest pain on breathing: Secondary | ICD-10-CM

## 2018-07-11 DIAGNOSIS — I808 Phlebitis and thrombophlebitis of other sites: Secondary | ICD-10-CM | POA: Diagnosis not present

## 2018-07-11 MED ORDER — ACETAMINOPHEN 500 MG PO TABS: 1000.0000 mg | ORAL_TABLET | Freq: Two times a day (BID) | ORAL | 0 refills | Status: AC

## 2018-07-11 MED ORDER — ASPIRIN EC 81 MG PO TBEC: 81.0000 mg | DELAYED_RELEASE_TABLET | Freq: Every day | ORAL | 3 refills | Status: AC

## 2018-07-11 NOTE — Patient Instructions (Addendum)
Medication Instructions:  Your physician has recommended you make the following change in your medication:   START taking Aspirin 81 mg (1 tablet) daily START  Taking tylenol 500mg  (2 tablets) two times per day STOP taking Eliquis If you need a refill on your cardiac medications before your next appointment, please call your pharmacy.   Lab work: NONE  Testing/Procedures: An EKG was performed today  Your physician has requested that you have an echocardiogram. Echocardiography is a painless test that uses sound waves to create images of your heart. It provides your doctor with information about the size and shape of your heart and how well your heart's chambers and valves are working. This procedure takes approximately one hour. There are no restrictions for this procedure.    Follow-Up: At [facility], you and your health needs are our priority.  As part of our continuing mission to provide you with exceptional heart care, we have created designated Provider Care Teams.  These Care Teams include your primary Cardiologist (physician) and Advanced Practice Providers (APPs -  Physician Assistants and Nurse Practitioners) who all work together to provide you with the care you need, when you need it. . You will need a follow up appointment with Dr. Bettina Gavia if your symptoms reoccur or worsen.  Any Other Special Instructions Will Be Listed Below  Acetaminophen tablets or caplets What is this medicine? ACETAMINOPHEN (a set a MEE noe fen) is a pain reliever. It is used to treat mild pain and fever. This medicine may be used for other purposes; ask your health care provider or pharmacist if you have questions. COMMON BRAND NAME(S): Aceta, Actamin, Anacin Aspirin Free, Genapap, Genebs, Mapap, Pain & Fever, Pain and Fever, PAIN RELIEF, PAIN RELIEF Extra Strength, Pain Reliever, Panadol, PHARBETOL, Q-Pap, Q-Pap Extra Strength, Tylenol, Tylenol CrushableTablet, Tylenol Extra Strength, XS No Aspirin,  XS Pain Reliever What should I tell my health care provider before I take this medicine? They need to know if you have any of these conditions: -if you often drink alcohol -liver disease -an unusual or allergic reaction to acetaminophen, other medicines, foods, dyes, or preservatives -pregnant or trying to get pregnant -breast-feeding How should I use this medicine? Take this medicine by mouth with a glass of water. Follow the directions on the package or prescription label. Take your medicine at regular intervals. Do not take your medicine more often than directed. Talk to your pediatrician regarding the use of this medicine in children. While this drug may be prescribed for children as young as 49 years of age for selected conditions, precautions do apply. Overdosage: If you think you have taken too much of this medicine contact a poison control center or emergency room at once. NOTE: This medicine is only for you. Do not share this medicine with others. What if I miss a dose? If you miss a dose, take it as soon as you can. If it is almost time for your next dose, take only that dose. Do not take double or extra doses. What may interact with this medicine? -alcohol -imatinib -isoniazid -other medicines with acetaminophen This list may not describe all possible interactions. Give your health care provider a list of all the medicines, herbs, non-prescription drugs, or dietary supplements you use. Also tell them if you smoke, drink alcohol, or use illegal drugs. Some items may interact with your medicine. What should I watch for while using this medicine? Tell your doctor or health care professional if the pain lasts more than  10 days (5 days for children), if it gets worse, or if there is a new or different kind of pain. Also, check with your doctor if a fever lasts for more than 3 days. Do not take other medicines that contain acetaminophen with this medicine. Always read labels carefully. If  you have questions, ask your doctor or pharmacist. If you take too much acetaminophen get medical help right away. Too much acetaminophen can be very dangerous and cause liver damage. Even if you do not have symptoms, it is important to get help right away. What side effects may I notice from receiving this medicine? Side effects that you should report to your doctor or health care professional as soon as possible: -allergic reactions like skin rash, itching or hives, swelling of the face, lips, or tongue -breathing problems -fever or sore throat -redness, blistering, peeling or loosening of the skin, including inside the mouth -trouble passing urine or change in the amount of urine -unusual bleeding or bruising -unusually weak or tired -yellowing of the eyes or skin Side effects that usually do not require medical attention (report to your doctor or health care professional if they continue or are bothersome): -headache -nausea, stomach upset This list may not describe all possible side effects. Call your doctor for medical advice about side effects. You may report side effects to FDA at 1-800-FDA-1088. Where should I keep my medicine? Keep out of reach of children. Store at room temperature between 20 and 25 degrees C (68 and 77 degrees F). Protect from moisture and heat. Throw away any unused medicine after the expiration date. NOTE: This sheet is a summary. It may not cover all possible information. If you have questions about this medicine, talk to your doctor, pharmacist, or health care provider.  2019 Elsevier/Gold Standard (2012-12-24 12:54:16)   Aspirin and Your Heart  Aspirin is a medicine that prevents the cells in the blood that are used for clotting, called platelets, from sticking together. Aspirin can be used to help reduce the risk of blood clots, heart attacks, and other heart-related problems. Can I take aspirin? Your health care provider will help you determine whether  it is safe and beneficial for you to take aspirin daily. Taking aspirin daily may be helpful if you:  Have had a heart attack or chest pain.  Are at risk for a heart attack.  Have undergone open-heart surgery, such as coronary artery bypass surgery (CABG).  Have had coronary angioplasty or a stent.  Have had certain types of stroke or transient ischemic attack (TIA).  Have peripheral artery disease (PAD).  Have chronic heart rhythm problems such as atrial fibrillation and cannot take an anticoagulant.  Have valve disease or have had surgery on a valve. What are the risks? Daily use of aspirin can cause side effects. Some of these include:  Bleeding. Bleeding problems can be minor or serious. An example of a minor problem is a cut that does not stop bleeding. An example of a more serious problem is stomach bleeding or, rarely, bleeding into the brain. Your risk of bleeding is increased if you are also taking non-steroidal anti-inflammatory drugs (NSAIDs).  Increased bruising.  Upset stomach.  An allergic reaction. People who have nasal polyps have an increased risk of developing an aspirin allergy. General guidelines  Take aspirin only as told by your health care provider. Make sure that you understand how much you should take and what form you should take. The two forms of aspirin are: ?  Non-enteric-coated.This type of aspirin does not have a coating and is absorbed quickly. This type of aspirin also comes in a chewable form. ? Enteric-coated. This type of aspirin has a coating that releases the medicine very slowly. Enteric-coated aspirin might cause less stomach upset than non-enteric-coated aspirin. This type of aspirin should not be chewed or crushed.  Limit alcohol intake to no more than 1 drink a day for nonpregnant women and 2 drinks a day for men. Drinking alcohol increases your risk of bleeding. One drink equals 12 oz of beer, 5 oz of wine, or 1 oz of hard liquor. Contact  a health care provider if you:  Have unusual bleeding or bruising.  Have stomach pain or nausea.  Have ringing in your ears.  Have an allergic reaction that causes: ? Hives. ? Itchy skin. ? Swelling of the lips, tongue, or face. Get help right away if you:  Notice that your bowel movements are bloody, dark red, or black in color.  Vomit or cough up blood.  Have blood in your urine.  Cough, have noisy breathing (wheeze), or feel short of breath.  Have chest pain, especially if the pain spreads to the arms, back, neck, or jaw.  Have a severe headache, or a headache with confusion, or dizziness. These symptoms may represent a serious problem that is an emergency. Do not wait to see if the symptoms will go away. Get medical help right away. Call your local emergency services (911 in the [place] Do not drive yourself to the hospital. Summary  Aspirin can be used to help reduce the risk of blood clots, heart attacks, and other heart-related problems.  Daily use of aspirin can increase your risk of side effects. Your health care provider will help you determine whether it is safe and beneficial for you to take aspirin daily.  Take aspirin only as told by your health care provider. Make sure that you understand how much you can take and what form you can take. This information is not intended to replace advice given to you by your health care provider. Make sure you discuss any questions you have with your health care provider. Document Released: 04/14/2008 Document Revised: 03/02/2017 Document Reviewed: 03/02/2017 Elsevier Interactive Patient Education  2019 [employer].

## 2018-07-11 NOTE — Progress Notes (Signed)
Acknowledge note from [place] given

## 2018-07-11 NOTE — Progress Notes (Signed)
Cardiology Office Note:    Date:  07/11/2018   ID:  Angela Shah, DOB 03/02/1976, MRN [id number]  PCP:  Angela Mclean, MD  Cardiologist:  Angela More, MD   Referring MD: Angela Mclean, MD  ASSESSMENT:    1. Costochondral chest pain   2. Superficial thrombophlebitis of left upper extremity   3. History of DVT (deep vein thrombosis)   4.      Abnormal EKG I repeated in the office today it is a little bit Shah than poor R wave progression suggested but not diagnostic of previous myocardial infarction Angela Shah is an outpatient echocardiogram and if that is normal I do not think she requires further ischemic evaluation if abnormal cardiac CTA would be appropriate my expectation is that should be reassuring and I will plan to see her as an outpatient as needed and bring her back to the office if it is abnormal PLAN:    In order of problems listed above:  1. She has chronic costochondral chest pain overuse injury at this time I told her to take Tylenol 2 tabs in the morning and at lunch and she does not want interfere with her work.  Other choices involve referral to physical therapy for iontophoresis in the high potency steroid and I think once she is off her anticoagulant she could take over-the-counter Relafen as needed.  Provider EKG repeated my office today is reassuring I do not think she needs an ischemia evaluation none of her presentation was acute coronary syndrome she is not having anginal discomfort. 2. I suspect the mechanism is also related to her occupation with repeated lifting.  She has no evidence of a hypercoagulable state and once symptomatic improved she can discontinue anticoagulant take low-dose aspirin which has a nice effect preventing recurrent thrombophlebitis. 3. Stable resolved no evidence of underlying malignancy over time and on recent CT scans and no evidence of prothrombotic state. 4. See above #4 abnormal EKG  Next appointment as needed   Medication  Adjustments/Labs and Tests Ordered: Current medicines are reviewed at length with the patient today.  Concerns regarding medicines are outlined above.  No orders of the defined types were placed in this encounter.  No orders of the defined types were placed in this encounter.    Chief Complaint  Patient presents with  . New Patient (Initial Visit)    History of Present Illness:    Angela Shah is a 43 y.o. female who is being seen today for the evaluation of an abnormal EKG at the request of Copland, Gay Filler, MD.  She was seen at [facility] 07/06/18 and had very extensive evaluation for nausea vomiting diarrhea chest pain.  CTA of the abdomen and pelvis were unremarkable no evidence of pulmonary embolism laboratory studies including a lipase were normal.  She had a thyroid nodule that was known she was recommended to have an echocardiogram and a stress test performed at hospital discharge with diagnoses of viral gastroenteritis allergic reaction to contrast dye abdominal pain shortness of breath left-sided chest pain and thyroid nodule.  She was given supportive symptomatic care in the emergency room IV fluids Benadryl and Pepcid.  She has a history of left lower extremity DVT in 2011 and she had a normal duplex of the lower extremity performed 10/30/2017 we will schedule for duplex of the upper extremities 06/11/2018 but not performed.  She had an office visit 06/18/2018 as felt clinically to have superficial  thrombophlebitis in the  left upper extremity was placed on full dose anticoagulation there was a discussion of a consultation with oncology with a hypercoagulable panel ordered results reviewed in detail and were all normal  She is seen by me in follow-up for that emergency room visit.  Her work involves repetitive lifting and she noticed that she gets intermittent sharp localized chest pain tender to the touch.  As part of her extensive ED evaluation she  complained about that.  On that I do an EKG in my office today if it is normal I do not think she needs a further ischemic evaluation after reviewing her medical records.  She relates to me that she had a popliteal thrombophlebitis in 2011 and took Lovenox initially then warfarin for 6 months.  She has had no recurrence.  Again she does repetitive lifting at work and she noticed a tender vein in the right upper extremity at and above the elbow and was found to have a superficial thrombophlebitis in a small vein on duplex at [facility] she was started on Xarelto and she had abdominal pain and itching she was switched to Eliquis and she is tolerated and she is plan to continue until she is seen back by her primary care physician the symptoms have resolved in the arm quickly.  She has had no shortness of breath with it.  When she was sick in the emergency room her heart rate was rapid it was sinus tachycardia she is having no palpitation or syncope no anginal discomfort or shortness of breath or edema Past Medical History:  Diagnosis Date  . Clotting disorder ([place])   . DVT (deep venous thrombosis) ([place])     Past Surgical History:  Procedure Laterality Date  . Ring tubal ligation      Current Medications: Current Meds  Medication Sig  . apixaban (ELIQUIS) 5 MG TABS tablet Take 1 tablet (5 mg total) by mouth 2 (two) times daily.  . hydrochlorothiazide (HYDRODIURIL) 12.5 MG tablet Take 1 daily as needed for leg swelling- use just when needed  . medroxyPROGESTERone (DEPO-PROVERA) 150 MG/ML injection Inject 1 mL (150 mg total) into the muscle every 3 (three) months.  . ondansetron (ZOFRAN-ODT) 4 MG disintegrating tablet Take 4 mg by mouth daily as needed.   Current Facility-Administered Medications for the 07/11/18 encounter (Office Visit) with Richardo Priest, MD  Medication  . medroxyPROGESTERone (DEPO-PROVERA) injection 150 mg     Allergies:   Iodinated diagnostic agents and  Rivaroxaban   Social History   Socioeconomic History  . Marital status: Single    Spouse name: Not on file  . Number of children: Not on file  . Years of education: Not on file  . Highest education level: Not on file  Occupational History  . Not on file  Social Needs  . Financial resource strain: Not on file  . Food insecurity:    Worry: Not on file    Inability: Not on file  . Transportation needs:    Medical: Not on file    Non-medical: Not on file  Tobacco Use  . Smoking status: Never Smoker  . Smokeless tobacco: Never Used  Substance and Sexual Activity  . Alcohol use: No  . Drug use: No  . Sexual activity: Yes    Birth control/protection: Surgical, Injection  Lifestyle  . Physical activity:    Days per week: Not on file    Minutes per session: Not on file  . Stress: Not on file  Relationships  . Social connections:    Talks on phone: Not on file    Gets together: Not on file    Attends religious service: Not on file    Active member of club or organization: Not on file    Attends meetings of clubs or organizations: Not on file    Relationship status: Not on file  Other Topics Concern  . Not on file  Social History Narrative  . Not on file     Family History: The patient's family history includes Cancer in her maternal grandmother and sister; Heart disease in her mother.  ROS:   Review of Systems  Constitution: Negative.  HENT: Negative.   Eyes: Negative.   Cardiovascular: Positive for chest pain.  Respiratory: Negative.   Endocrine: Negative.   Hematologic/Lymphatic: Negative.   Skin: Negative.   Musculoskeletal: Negative.   Gastrointestinal: Negative.   Genitourinary: Negative.   Neurological: Negative.   Psychiatric/Behavioral: Negative.   Allergic/Immunologic: Negative.    Please see the history of present illness.     All other systems reviewed and are negative.  EKGs/Labs/Other Studies Reviewed:    The following studies were reviewed  today:   EKG:  EKG is  ordered today.  The ekg ordered today demonstrates sinus rhythm poor R wave progression possible previous anterior MI but may well be variant of normal.  I personally reviewed this  EKG [facility] 07/06/2018 sinus tachycardia nonspecific ST old anterior MI seen on previous EKG.  CTA of the chest performed 07/06/2018 showed no evidence of pulmonary effusion and cardiac structures were normal without effusion or coronary calcification  Anticardiolipin antibodies IgA g and M.all normal, homocystine normal Antithrombin III function normal protein C activity normal protein C total normal protein S activity normal protein S total normal lupus anticoagulant panel normal beta-2 glycoprotein antibodies IgG IgM IgA normal factor V Leiden normal prothrombin gene mutation normal.  Recent Labs:  [facility] 07/06/2020 troponin I assays were undetectable CMP was normal except for glucose of 130 potassium 4.0 GFR greater than 90 cc CBC her white count of 8000 normal hemoglobin hematocrit patient was microcytic with an MCV of 74 her INR was normal 1.14  06/18/2018: ALT 12; BUN 13; Creatinine 0.86; Hemoglobin 12.8; Platelet Count 276; Potassium 3.4; Sodium 137  Recent Lipid Panel    Component Value Date/Time   CHOL 186 08/15/2016 1354   TRIG 57.0 08/15/2016 1354   HDL 53.70 08/15/2016 1354   CHOLHDL 3 08/15/2016 1354   VLDL 11.4 08/15/2016 1354   LDLCALC 121 (H) 08/15/2016 1354    Physical Exam:    VS:  BP 120/74   Pulse 88   Ht 5\' 4"  (1.626 m)   Wt 171 lb 1.3 oz (77.6 kg)   SpO2 98%   BMI 29.37 kg/m     Wt Readings from Last 3 Encounters:  07/11/18 171 lb 1.3 oz (77.6 kg)  07/10/18 169 lb (76.7 kg)  06/18/18 173 lb (78.5 kg)     GEN:  Well nourished, well developed in no acute distress HEENT: Normal NECK: No JVD; No carotid bruits LYMPHATICS: No lymphadenopathy right upper extremity medial there is a palpable segment of thrombosed  superficial vein above the elbow it is not tender or inflamed CARDIAC: RRR, no murmurs, rubs, gallops RESPIRATORY:  Clear to auscultation without rales, wheezing or rhonchi  ABDOMEN: Soft, non-tender, non-distended MUSCULOSKELETAL:  No edema; No deformity  SKIN: Warm and dry NEUROLOGIC:  Alert and oriented x  3 PSYCHIATRIC:  Normal affect     Signed, Angela More, MD  07/11/2018 3:33 PM     [facility]

## 2018-07-19 ENCOUNTER — Ambulatory Visit (HOSPITAL_BASED_OUTPATIENT_CLINIC_OR_DEPARTMENT_OTHER)
Admission: RE | Admit: 2018-07-19 | Discharge: 2018-07-19 | Disposition: A | Discharge status: Home / Self Care | Payer: 59 | Source: Ambulatory Visit | Attending: Cardiology | Admitting: Cardiology

## 2018-07-19 DIAGNOSIS — Z86718 Personal history of other venous thrombosis and embolism: Secondary | ICD-10-CM | POA: Diagnosis present

## 2018-07-19 DIAGNOSIS — R9431 Abnormal electrocardiogram [ECG] [EKG]: Secondary | ICD-10-CM | POA: Diagnosis not present

## 2018-07-19 NOTE — Progress Notes (Signed)
  Echocardiogram 2D Echocardiogram has been performed.  Husam Hohn T Curry Dulski 07/19/2018, 11:32 AM

## 2018-07-30 ENCOUNTER — Telehealth: Payer: Self-pay | Admitting: Family

## 2018-07-30 ENCOUNTER — Inpatient Hospital Stay (HOSPITAL_BASED_OUTPATIENT_CLINIC_OR_DEPARTMENT_OTHER): Payer: 59 | Admitting: Family

## 2018-07-30 ENCOUNTER — Encounter (HOSPITAL_BASED_OUTPATIENT_CLINIC_OR_DEPARTMENT_OTHER): Payer: 59

## 2018-07-30 ENCOUNTER — Ambulatory Visit (HOSPITAL_BASED_OUTPATIENT_CLINIC_OR_DEPARTMENT_OTHER)
Admission: RE | Admit: 2018-07-30 | Discharge: 2018-07-30 | Disposition: A | Discharge status: Home / Self Care | Payer: 59 | Source: Ambulatory Visit | Attending: Family Medicine | Admitting: Family Medicine

## 2018-07-30 ENCOUNTER — Other Ambulatory Visit: Payer: Self-pay

## 2018-07-30 ENCOUNTER — Encounter: Payer: Self-pay | Admitting: Family

## 2018-07-30 ENCOUNTER — Inpatient Hospital Stay: Payer: 59 | Attending: Family

## 2018-07-30 VITALS — BP 122/90 | HR 77 | Resp 19 | Ht 64.5 in | Wt 173.5 lb

## 2018-07-30 DIAGNOSIS — D509 Iron deficiency anemia, unspecified: Secondary | ICD-10-CM | POA: Diagnosis not present

## 2018-07-30 DIAGNOSIS — Z86718 Personal history of other venous thrombosis and embolism: Secondary | ICD-10-CM | POA: Diagnosis not present

## 2018-07-30 DIAGNOSIS — Z7982 Long term (current) use of aspirin: Secondary | ICD-10-CM

## 2018-07-30 DIAGNOSIS — I809 Phlebitis and thrombophlebitis of unspecified site: Secondary | ICD-10-CM | POA: Insufficient documentation

## 2018-07-30 DIAGNOSIS — I808 Phlebitis and thrombophlebitis of other sites: Secondary | ICD-10-CM

## 2018-07-30 LAB — CBC WITH DIFFERENTIAL (CANCER CENTER ONLY)
Abs Immature Granulocytes: 0.01 10*3/uL (ref 0.00–0.07)
Basophils Absolute: 0 10*3/uL (ref 0.0–0.1)
Basophils Relative: 0 %
EOS ABS: 0.1 10*3/uL (ref 0.0–0.5)
EOS PCT: 1 %
HEMATOCRIT: 42 % (ref 36.0–46.0)
Hemoglobin: 13.2 g/dL (ref 12.0–15.0)
Immature Granulocytes: 0 %
Lymphocytes Relative: 52 %
Lymphs Abs: 3.6 10*3/uL (ref 0.7–4.0)
MCH: 23.7 pg — ABNORMAL LOW (ref 26.0–34.0)
MCHC: 31.4 g/dL (ref 30.0–36.0)
MCV: 75.5 fL — ABNORMAL LOW (ref 80.0–100.0)
Monocytes Absolute: 0.6 10*3/uL (ref 0.1–1.0)
Monocytes Relative: 9 %
Neutro Abs: 2.7 10*3/uL (ref 1.7–7.7)
Neutrophils Relative %: 38 %
Platelet Count: 326 10*3/uL (ref 150–400)
RBC: 5.56 MIL/uL — ABNORMAL HIGH (ref 3.87–5.11)
RDW: 15.3 % (ref 11.5–15.5)
WBC Count: 6.9 10*3/uL (ref 4.0–10.5)
nRBC: 0 % (ref 0.0–0.2)

## 2018-07-30 LAB — CMP (CANCER CENTER ONLY)
ALT: 14 U/L (ref 0–44)
AST: 12 U/L — ABNORMAL LOW (ref 15–41)
Albumin: 3.9 g/dL (ref 3.5–5.0)
Alkaline Phosphatase: 81 U/L (ref 38–126)
Anion gap: 5 (ref 5–15)
BUN: 10 mg/dL (ref 6–20)
CO2: 27 mmol/L (ref 22–32)
Calcium: 8.9 mg/dL (ref 8.9–10.3)
Chloride: 108 mmol/L (ref 98–111)
Creatinine: 0.88 mg/dL (ref 0.44–1.00)
GFR, Est AFR Am: >60 mL/min (ref >60)
GFR, Estimated: >60 mL/min (ref >60)
Glucose, Bld: 80 mg/dL (ref 70–99)
Potassium: 4.5 mmol/L (ref 3.5–5.1)
Sodium: 140 mmol/L (ref 135–145)
Total Bilirubin: 0.3 mg/dL (ref 0.3–1.2)
Total Protein: 6.6 g/dL (ref 6.5–8.1)

## 2018-07-30 NOTE — Telephone Encounter (Signed)
Appointments scheduled avs/calendar printed per 3/16 los

## 2018-07-30 NOTE — Progress Notes (Signed)
Bilateral Upper Extremity Venous Duplex   Right: No evidence of deep vein thrombosis in the upper extremity. No evidence of superficial vein thrombosis in the upper extremity. No evidence of thrombosis in the subclavian IJV. Unable to compress subclavian due to clavicale bone but it appearts to be patent.  Left: No evidence of deep vein thrombosis in the upper extremity. No evidence of superficial vein thrombosis in the upper extremity. No evidence of thrombosis in the subclavianor IJV. Unable to compress subclavian due to clavicale bone but it appears to be patent.     07/30/18  Jospeh Dilyn Osoria RDCS, RVT

## 2018-07-30 NOTE — Progress Notes (Signed)
Hematology and Oncology Follow Up Visit  Angela Shah [id number] Sep 06, 1975 43 y.o. 07/30/2018   Principle Diagnosis:  History DVT of popliteal vein in left lower extremity Superficial thrombus of the right upper medial arm  Current Therapy:   Aspirin 81 mg PO daily   Interim History:  Angela Shah is here today for follow-up. She is doing well and has no complaints at this time.  She states that her cardiologist took her off of Eliquis shortly after she started her samples and had her start taking aspirin 81 mg PO daily.  [place] today showed no evidence of deep or superficial thrombus in either upper extremity.  No episodes of bleeding, no bruising or petechiae.  She has mild iron deficiency anemia is taking 2 Flintstones vitamins daily.  She has occasional chills and palpitations.  No fever, n/v, cough, rash, dizziness, SOB, chest pain, abdominal pain or changes in bowel or bladder habits.  She has occasional puffiness in her left foot associated with previous DVT in that leg.  The numbness and tingling in her hands comes and goes. This sounds to be positional and improves with flexing her hands.  She has occasional episodes of dizziness due to vertigo.  No lymphadenopathy noted on exam.  She has maintained a good appetite and is staying well hydrated. Her weight is stable.   ECOG Performance Status: 1 - Symptomatic but completely ambulatory  Medications:  Allergies as of 07/30/2018      Reactions   Iodinated Diagnostic Agents Hives   Rivaroxaban Hives   Also causes headache      Medication List       Accurate as of July 30, 2018 12:54 PM. Always use your most recent med list.        acetaminophen 500 MG tablet Commonly known as:  TYLENOL Take 2 tablets (1,000 mg total) by mouth 2 (two) times daily.   aspirin EC 81 MG tablet Take 1 tablet (81 mg total) by mouth daily.   hydrochlorothiazide 12.5 MG tablet Commonly known as:  HYDRODIURIL Take 1 daily as needed for  leg swelling- use just when needed   medroxyPROGESTERone 150 MG/ML injection Commonly known as:  DEPO-PROVERA Inject 1 mL (150 mg total) into the muscle every 3 (three) months.       Allergies:  Allergies  Allergen Reactions  . Iodinated Diagnostic Agents Hives  . Rivaroxaban Hives    Also causes headache    Past Medical History, Surgical history, Social history, and Family History were reviewed and updated.  Review of Systems: All other 10 point review of systems is negative.   Physical Exam:  height is 5' 4.5" (1.638 m) and weight is 173 lb 8 oz (78.7 kg). Her blood pressure is 122/90 and her pulse is 77. Her respiration is 19 and oxygen saturation is 100%.   Wt Readings from Last 3 Encounters:  07/30/18 173 lb 8 oz (78.7 kg)  07/11/18 171 lb 1.3 oz (77.6 kg)  07/10/18 169 lb (76.7 kg)    Ocular: Sclerae unicteric, pupils equal, round and reactive to light Ear-nose-throat: Oropharynx clear, dentition fair Lymphatic: No cervical, supraclavicular or axillary adenopathy Lungs no rales or rhonchi, good excursion bilaterally Heart regular rate and rhythm, no murmur appreciated Abd soft, nontender, positive bowel sounds, no liver or spleen tip palpated on exam, no fluid wave  MSK no focal spinal tenderness, no joint edema Neuro: non-focal, well-oriented, appropriate affect Breasts: Deferred   Lab Results  Component Value Date   WBC  6.9 07/30/2018   HGB 13.2 07/30/2018   HCT 42.0 07/30/2018   MCV 75.5 (L) 07/30/2018   PLT 326 07/30/2018   Lab Results  Component Value Date   FERRITIN 12 06/18/2018   IRON 55 06/18/2018   TIBC 324 06/18/2018   UIBC 269 06/18/2018   IRONPCTSAT 17 (L) 06/18/2018   Lab Results  Component Value Date   RETICCTPCT 1.5 06/18/2018   RBC 5.56 (H) 07/30/2018   No results found for: KPAFRELGTCHN, LAMBDASER, KAPLAMBRATIO No results found for: IGGSERUM, IGA, IGMSERUM No results found for: Ronnald Ramp, A1GS, A2GS, Violet Baldy, MSPIKE, SPEI   Chemistry      Component Value Date/Time   NA 140 07/30/2018 1219   K 4.5 07/30/2018 1219   CL 108 07/30/2018 1219   CO2 27 07/30/2018 1219   BUN 10 07/30/2018 1219   CREATININE 0.88 07/30/2018 1219      Component Value Date/Time   CALCIUM 8.9 07/30/2018 1219   ALKPHOS 81 07/30/2018 1219   AST 12 (L) 07/30/2018 1219   ALT 14 07/30/2018 1219   BILITOT 0.3 07/30/2018 1219       Impression and Plan: Angela Shah is a very pleasant 43 yo African American female with history of DVT in the left lower extremity diagnosed in 2011 treated with 9 months of Coumadin and now a superficial thrombus of the right upper medial arm. She has a strong family history of thrombus on her mother's side.  US showed no evidence of deep or superficial thrombus in either upper extremity.  She stopped her Eliquis with cardiology and is now on 81 mg aspirin PO daily.  With her history (and still on Depo) we will have her increase this to 162 mg enteric coated aspirin daily.   She states that she did stop the Megace.  We will plan to see her back in another 6 months.  She promises to contact our office with any questions or concerns. We can certainly see her sooner if need be.   Angela Peace, NP 3/16/202012:54 PM

## 2018-10-04 ENCOUNTER — Ambulatory Visit (INDEPENDENT_AMBULATORY_CARE_PROVIDER_SITE_OTHER): Payer: 59 | Admitting: Obstetrics & Gynecology

## 2018-10-04 ENCOUNTER — Encounter: Payer: Self-pay | Admitting: Obstetrics & Gynecology

## 2018-10-04 ENCOUNTER — Other Ambulatory Visit: Payer: Self-pay

## 2018-10-04 ENCOUNTER — Other Ambulatory Visit: Payer: Self-pay | Admitting: Family Medicine

## 2018-10-04 VITALS — BP 131/85 | HR 84 | Wt 183.0 lb

## 2018-10-04 DIAGNOSIS — Z1239 Encounter for other screening for malignant neoplasm of breast: Secondary | ICD-10-CM | POA: Diagnosis not present

## 2018-10-04 DIAGNOSIS — N644 Mastodynia: Secondary | ICD-10-CM

## 2018-10-04 DIAGNOSIS — Z3042 Encounter for surveillance of injectable contraceptive: Secondary | ICD-10-CM | POA: Diagnosis not present

## 2018-10-04 MED ORDER — MEDROXYPROGESTERONE ACETATE 150 MG/ML IM SUSP
150.0000 mg | Freq: Once | INTRAMUSCULAR | Status: AC
  Administered 2018-10-04: 150 mg via INTRAMUSCULAR

## 2018-10-04 NOTE — Progress Notes (Signed)
History:  43 y.o. [id number] here today for eval of pain on left side shooting into left breast. Pt works at a [employer] and lifts often. Usually ~25 pounds. She cannot recall having had an injury at work recently. The pain is intermittent and not severe but, has been present over the past 2 months. Pt denies lumps in breast, nipple discharge or skin changes. She has 4 children. She breasted the last 2. She denies FH of breast cancer.    She also wants her Depo Provera injection today.    The following portions of the patient's history were reviewed and updated as appropriate: allergies, current medications, past family history, past medical history, past social history, past surgical history and problem list.  Review of Systems:  Pertinent items are noted in HPI.    Objective:  Physical Exam Blood pressure 131/85, pulse 84, weight 183 lb (83 kg).  CONSTITUTIONAL: Well-developed, well-nourished female in no acute distress.  HENT:  Normocephalic, atraumatic EYES: Conjunctivae and EOM are normal. No scleral icterus.  NECK: Normal range of motion SKIN: Skin is warm and dry. No rash noted. Not diaphoretic.No pallor. [place]: Alert and oriented to person, place, and time. Normal coordination.  Breast: symmetric, no nipple discharge or skin changes. No masses noted. NO axillary masses noted. There is slight tenderness over the scalene muscles on the left side.      Assessment & Plan:  Left side/breast pain   Screening mammogram  NSAIDS prn  F/u in Oct for annual   Contraception management  Depo Provera 150mg  IM today  Total face-to-face time with patient was 17 min.  Greater than 50% was spent in counseling and coordination of care with the patient.   Cameryn Chrisley L. Harraway-Smith, M.D., Cherlynn June

## 2018-10-04 NOTE — Progress Notes (Signed)
Pt states that she has been having sharp shooting pain in her left breast. chiquita l wilson, CMA

## 2018-10-10 ENCOUNTER — Ambulatory Visit (HOSPITAL_BASED_OUTPATIENT_CLINIC_OR_DEPARTMENT_OTHER)
Admission: RE | Admit: 2018-10-10 | Discharge: 2018-10-10 | Disposition: A | Discharge status: Home / Self Care | Payer: 59 | Source: Ambulatory Visit | Attending: Obstetrics & Gynecology | Admitting: Obstetrics & Gynecology

## 2018-10-10 ENCOUNTER — Other Ambulatory Visit: Payer: Self-pay

## 2018-10-10 DIAGNOSIS — N644 Mastodynia: Secondary | ICD-10-CM | POA: Insufficient documentation

## 2018-10-10 DIAGNOSIS — Z1239 Encounter for other screening for malignant neoplasm of breast: Secondary | ICD-10-CM

## 2018-10-10 DIAGNOSIS — Z1231 Encounter for screening mammogram for malignant neoplasm of breast: Secondary | ICD-10-CM | POA: Diagnosis not present

## 2018-10-25 ENCOUNTER — Encounter: Payer: Self-pay | Admitting: Family Medicine

## 2018-10-25 ENCOUNTER — Ambulatory Visit (INDEPENDENT_AMBULATORY_CARE_PROVIDER_SITE_OTHER): Payer: Managed Care, Other (non HMO) | Admitting: Internal Medicine

## 2018-10-25 ENCOUNTER — Other Ambulatory Visit: Payer: Self-pay

## 2018-10-25 DIAGNOSIS — R51 Headache: Secondary | ICD-10-CM

## 2018-10-25 DIAGNOSIS — R519 Headache, unspecified: Secondary | ICD-10-CM

## 2018-10-25 NOTE — Progress Notes (Signed)
Subjective:    Patient ID: Angela Shah, female    DOB: 1976-02-08, 43 y.o.   MRN: [id number]  DOS:  10/25/2018 Type of visit - description: Virtual Visit via Video Note  I connected with@ on 10/25/18 at  1:20 PM EDT by a video enabled telemedicine application and verified that I am speaking with the correct person using two identifiers.   THIS ENCOUNTER IS A VIRTUAL VISIT DUE TO COVID-19 - PATIENT WAS NOT SEEN IN THE OFFICE. PATIENT HAS CONSENTED TO VIRTUAL VISIT / TELEMEDICINE VISIT   Location of patient: home  Location of provider: office  I discussed the limitations of evaluation and management by telemedicine and the availability of in person appointments. The patient expressed understanding and agreed to proceed.  History of Present Illness: Acute visit Symptoms started 3 days ago with a persisting, on and off headache. Described the headache as intense at times but not the worst of her life. Located at the forehead, around the eyes and at the temples. Tylenol is not helping. Some light > noise intolerance.  No nausea. They feel different to her regular migraines (however "normal migraines"  also have photo and phonophobia, severe nausea and vomiting ; typically intense enough that she can function).  Medications reviewed, good compliance with depo shots    Review of Systems No fever chills Started few days ago with clear runny nose.  + Sinus congestion.  No cough No rash No known COVID-19 exposure. No focal weaknesses or paresthesias.  Past Medical History:  Diagnosis Date  . Clotting disorder ([place])   . DVT (deep venous thrombosis) ([place])     Past Surgical History:  Procedure Laterality Date  . Ring tubal ligation      Social History   Socioeconomic History  . Marital status: Single    Spouse name: Not on file  . Number of children: Not on file  . Years of education: Not on file  . Highest education level: Not on file  Occupational History  . Not on  file  Social Needs  . Financial resource strain: Not on file  . Food insecurity    Worry: Not on file    Inability: Not on file  . Transportation needs    Medical: Not on file    Non-medical: Not on file  Tobacco Use  . Smoking status: Never Smoker  . Smokeless tobacco: Never Used  Substance and Sexual Activity  . Alcohol use: No  . Drug use: No  . Sexual activity: Yes    Birth control/protection: Surgical, Injection  Lifestyle  . Physical activity    Days per week: Not on file    Minutes per session: Not on file  . Stress: Not on file  Relationships  . Social Herbalist on phone: Not on file    Gets together: Not on file    Attends religious service: Not on file    Active member of club or organization: Not on file    Attends meetings of clubs or organizations: Not on file    Relationship status: Not on file  . Intimate partner violence    Fear of current or ex partner: Not on file    Emotionally abused: Not on file    Physically abused: Not on file    Forced sexual activity: Not on file  Other Topics Concern  . Not on file  Social History Narrative  . Not on file      Allergies  as of 10/25/2018      Reactions   Iodinated Diagnostic Agents Hives   Rivaroxaban Hives   Also causes headache      Medication List       Accurate as of October 25, 2018  1:20 PM. If you have any questions, ask your nurse or doctor.        acetaminophen 500 MG tablet Commonly known as: TYLENOL Take 2 tablets (1,000 mg total) by mouth 2 (two) times daily.   aspirin EC 81 MG tablet Take 1 tablet (81 mg total) by mouth daily.   hydrochlorothiazide 12.5 MG tablet Commonly known as: HYDRODIURIL Take 1 daily as needed for leg swelling- use just when needed   medroxyPROGESTERone 150 MG/ML injection Commonly known as: DEPO-PROVERA Inject 1 mL (150 mg total) into the muscle every 3 (three) months.   megestrol 40 MG tablet Commonly known as: MEGACE Take 40 mg by mouth  daily.   megestrol 40 MG tablet Commonly known as: MEGACE TAKE 2 TABLETS BY MOUTH TWICE DAILY IN THE EVENT OF HEAVY BLEEDING           Objective:   Physical Exam There were no vitals taken for this visit. This is a virtual video visit, patient is alert oriented x3, no apparent distress, speech is fluent.  Although quality of the video was poor, face is symmetric.    Assessment     43 year old female, PMH of DVT in 2011, diagnosed few months ago with a superficial thrombosis of the right arm s/pXarelto for 6 weeks, currently on Depo-Provera/ASA  Headaches: As described above, she has a history of migraines, reports that current headaches are slightly different from previous migraines however according to the description of her regular migraines, this headache could be simply mild migraine.  See HPI. On chart review, she was evaluated with severe headache at the ER and a CT of the head was normal 06/23/2018. She is currently taking Megace despite the fact that hematology recommended against it thanks she is more prone to have clots. Plan: CT head stat to rule out cerebral venous sinus thrombosis (discussed with radiology, ideal test would be a CT head venogram with contrast however the patient is allergic to contrast) If negative, will treat as a sinus infection vs migraine. Addendum 10/26/2018: My staff informed me that the patient does not have coverage for CT so is likely he will not pursue it.  Will treat her then as sinusitis with Augmentin and prednisone, follow-up next week with PCP, ER if symptoms severe.  See phone note  History of DVT: Recommend to stop Megace as recommended by hematology.

## 2018-10-26 ENCOUNTER — Telehealth: Payer: Self-pay | Admitting: Internal Medicine

## 2018-10-26 MED ORDER — PREDNISONE 10 MG PO TABS: ORAL_TABLET | ORAL | 0 refills | Status: DC

## 2018-10-26 MED ORDER — AMOXICILLIN-POT CLAVULANATE 875-125 MG PO TABS: 1.0000 | ORAL_TABLET | Freq: Two times a day (BID) | ORAL | 0 refills | Status: DC

## 2018-10-26 NOTE — Telephone Encounter (Signed)
Advise patient, until the CT head this is scheduled I recommend to start treatment with Augmentin and prednisone for possible sinusitis, prescriptions sent Please arrange a follow-up with PCP next week Advised to go to the ER if she has severe headache. Advised to stop Megace as recommended by hematology.

## 2018-10-26 NOTE — Telephone Encounter (Signed)
[facility] as they requested prior authorization [id number]): Case reference: [id number] I was advised that they need my last office visit note faxed to: [phone] They also need a copy of all the patient's demographics

## 2018-10-26 NOTE — Telephone Encounter (Signed)
Spoke w/ Pt- informed of recommendations. Mel Almond- can you reach out to Pt and schedule her w/ Dr. Lorelei Pont for next week?

## 2018-10-26 NOTE — Telephone Encounter (Signed)
Patient scheduled for doxy visit next Wednesday.

## 2018-10-30 NOTE — Progress Notes (Signed)
[place] at [facility] [address] [place], [place] [address] [phone]  Date:  10/31/2018   Name:  Angela Shah   DOB:  1975/10/11   MRN:  [id number]  PCP:  Darreld Mclean, MD    Chief Complaint: No chief complaint on file.   History of Present Illness:  Angela Shah is a 43 y.o. very pleasant female patient who presents with the following:  Patient here today with concern of sinusitis Virtual visit today Pt is at home, provider is at office  Pt ID confirmed with 2 factors, she gives consent for virtual visit today  I saw her most recently in January, for an upper extremity superficial thrombophlebitis.  As this was her second clot, we had her see hematology.  At that point they had her continue aspirin daily, and the plan to see her in 6 months  She was seen by Dr. Larose Kells last week with a HA- they planned for a CT but she did not end up getting this done due to financial concerns.  The HA resolved, she is taking amoxicillin for a sinus infection She also finished up a course of prednisone Today she has concern of a cough.  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 Superficial thrombophlebitis 07/11/2018  . Fibroids, subserous 08/21/2017  . History of DVT (deep vein thrombosis) 08/21/2017  . Dysmenorrhea 09/12/2016    Past Medical History:  Diagnosis Date  . Clotting disorder ([place])   . DVT (deep venous thrombosis) ([place])     Past Surgical History:  Procedure Laterality Date  . Ring tubal ligation       Social History   Tobacco Use  . Smoking status: Never Smoker  . Smokeless tobacco: Never Used  Substance Use Topics  . Alcohol use: No  . Drug use: No    Family History  Problem Relation Age of Onset  . Heart disease Mother   . Cancer Sister        Ovarian cancer  . Cancer Maternal Grandmother        Lung cancer    Allergies  Allergen Reactions  . Iodinated Diagnostic Agents Hives  . Rivaroxaban Hives    Also causes headache    Medication list has been reviewed and updated.  Current Outpatient Medications on File Prior to Visit  Medication Sig Dispense Refill  . acetaminophen (TYLENOL) 500 MG tablet Take 2 tablets (1,000 mg total) by mouth 2 (two) times daily. 30 tablet 0  . amoxicillin-clavulanate (AUGMENTIN) 875-125 MG tablet Take 1 tablet by mouth 2 (two) times daily. 20 tablet 0  . aspirin EC 81 MG tablet Take 1 tablet (81 mg total) by mouth daily. 90 tablet 3  . hydrochlorothiazide (HYDRODIURIL) 12.5 MG tablet Take 1 daily as needed for leg swelling- use just when needed 30 tablet 3  . medroxyPROGESTERone (DEPO-PROVERA) 150 MG/ML injection Inject 1 mL (150 mg total) into the muscle every 3 (  three) months. 1 mL 2  . megestrol (MEGACE) 40 MG tablet TAKE 2 TABLETS BY MOUTH TWICE DAILY IN THE EVENT OF HEAVY BLEEDING 120 tablet 5  . megestrol (MEGACE) 40 MG tablet Take 40 mg by mouth daily.    . predniSONE (DELTASONE) 10 MG tablet 3 tabs x 2 days, 2 tabs x 2 days, 1 tab x 2 days 12 tablet 0   Current Facility-Administered Medications on File Prior to Visit  Medication Dose Route Frequency Provider Last Rate Last Dose  . medroxyPROGESTERone (DEPO-PROVERA) injection 150 mg  150 mg Intramuscular Q90 days Truett Mainland, DO   150 mg at 07/10/18 1123    Review of Systems:  As per HPI- otherwise negative.   Physical Examination: There were no vitals filed for this visit. There were no vitals filed for this visit. There is no height or weight on file to calculate  BMI. Ideal Body Weight:    Pt observed on video- she looks well, no cough/ wheezing/ SOB or distress is observed She is following her temp- no fever    Assessment and Plan:   ICD-10-CM   1. Cough  R05 albuterol (VENTOLIN HFA) 108 (90 Base) MCG/ACT inhaler   Virtual visit today for concern of cough- she was seen a week ago with HA and possible sinus infection. Concern for COVID as she now has this cough rx for albuterol to use as needed for wheezing Set up COVID testing At this point she is in no distress, cautioned to seek emergency care if this should develop  Follow-up: No follow-ups on file.  Meds ordered this encounter  Medications  . albuterol (VENTOLIN HFA) 108 (90 Base) MCG/ACT inhaler    Sig: Inhale 2 puffs into the lungs every 6 (six) hours as needed for wheezing or shortness of breath.    Dispense:  1 Inhaler    Refill:  0   No orders of the defined types were placed in this encounter.   Will put a work note into her mychart account - OOW until covid results back   Signed Lamar Blinks, MD

## 2018-10-31 ENCOUNTER — Encounter: Payer: Self-pay | Admitting: Family Medicine

## 2018-10-31 ENCOUNTER — Other Ambulatory Visit: Payer: Managed Care, Other (non HMO)

## 2018-10-31 ENCOUNTER — Other Ambulatory Visit: Payer: Self-pay | Admitting: Family Medicine

## 2018-10-31 ENCOUNTER — Ambulatory Visit (INDEPENDENT_AMBULATORY_CARE_PROVIDER_SITE_OTHER): Payer: Managed Care, Other (non HMO) | Admitting: Family Medicine

## 2018-10-31 ENCOUNTER — Telehealth: Payer: Self-pay | Admitting: General Practice

## 2018-10-31 ENCOUNTER — Telehealth: Payer: Self-pay

## 2018-10-31 DIAGNOSIS — R05 Cough: Secondary | ICD-10-CM

## 2018-10-31 DIAGNOSIS — R059 Cough, unspecified: Secondary | ICD-10-CM

## 2018-10-31 DIAGNOSIS — Z20828 Contact with and (suspected) exposure to other viral communicable diseases: Secondary | ICD-10-CM | POA: Diagnosis not present

## 2018-10-31 DIAGNOSIS — Z20822 Contact with and (suspected) exposure to covid-19: Secondary | ICD-10-CM

## 2018-10-31 MED ORDER — ALBUTEROL SULFATE HFA 108 (90 BASE) MCG/ACT IN AERS: 2.0000 | INHALATION_SPRAY | Freq: Four times a day (QID) | RESPIRATORY_TRACT | 0 refills | Status: DC | PRN

## 2018-10-31 NOTE — Addendum Note (Signed)
Addended by: Denman George on: 10/31/2018 11:18 AM   Modules accepted: Orders

## 2018-10-31 NOTE — Telephone Encounter (Signed)
Pt has been scheduled for covid testing.  ° °

## 2018-10-31 NOTE — Telephone Encounter (Signed)
Per provider order patient is to be tested for covid-19 for symptoms such as sore throat, cough, sinus congestion. Please schedule her for testing.

## 2018-10-31 NOTE — Telephone Encounter (Signed)
Pt has been scheduled for covid testing. Scheduled with pt directly. Pt is scheduled at the [facility] location Pt was referred by: Lamar Blinks MD

## 2018-11-03 LAB — NOVEL CORONAVIRUS, NAA: SARS-CoV-2, NAA: NOT DETECTED

## 2018-11-08 ENCOUNTER — Encounter: Payer: Self-pay | Admitting: Family Medicine

## 2018-11-20 ENCOUNTER — Other Ambulatory Visit (HOSPITAL_COMMUNITY)
Admission: RE | Admit: 2018-11-20 | Discharge: 2018-11-20 | Disposition: A | Discharge status: Home / Self Care | Payer: Managed Care, Other (non HMO) | Source: Ambulatory Visit | Attending: Advanced Practice Midwife | Admitting: Advanced Practice Midwife

## 2018-11-20 ENCOUNTER — Ambulatory Visit (INDEPENDENT_AMBULATORY_CARE_PROVIDER_SITE_OTHER): Payer: Managed Care, Other (non HMO) | Admitting: Advanced Practice Midwife

## 2018-11-20 ENCOUNTER — Other Ambulatory Visit: Payer: Self-pay

## 2018-11-20 ENCOUNTER — Encounter: Payer: Self-pay | Admitting: Advanced Practice Midwife

## 2018-11-20 VITALS — BP 119/80 | HR 79 | Ht 64.5 in | Wt 179.1 lb

## 2018-11-20 DIAGNOSIS — B373 Candidiasis of vulva and vagina: Secondary | ICD-10-CM

## 2018-11-20 DIAGNOSIS — N898 Other specified noninflammatory disorders of vagina: Secondary | ICD-10-CM

## 2018-11-20 DIAGNOSIS — B3731 Acute candidiasis of vulva and vagina: Secondary | ICD-10-CM

## 2018-11-20 NOTE — Progress Notes (Signed)
Pt states that she used

## 2018-11-20 NOTE — Patient Instructions (Signed)
Vaginitis Vaginitis is a condition in which the vaginal tissue swells and becomes red (inflamed). This condition is most often caused by a change in the normal balance of bacteria and yeast that live in the vagina. This change causes an overgrowth of certain bacteria or yeast, which causes the inflammation. There are different types of vaginitis, but the most common types are:  Bacterial vaginosis.  Yeast infection (candidiasis).  Trichomoniasis vaginitis. This is a sexually transmitted disease (STD).  Viral vaginitis.  Atrophic vaginitis.  Allergic vaginitis. What are the causes? The cause of this condition depends on the type of vaginitis. It can be caused by:  Bacteria (bacterial vaginosis).  Yeast, which is a fungus (yeast infection).  A parasite (trichomoniasis vaginitis).  A virus (viral vaginitis).  Low hormone levels (atrophic vaginitis). Low hormone levels can occur during pregnancy, breastfeeding, or after menopause.  Irritants, such as bubble baths, scented tampons, and feminine sprays (allergic vaginitis). Other factors can change the normal balance of the yeast and bacteria that live in the vagina. These include:  Antibiotic medicines.  Poor hygiene.  Diaphragms, vaginal sponges, spermicides, birth control pills, and intrauterine devices (IUD).  Sex.  Infection.  Uncontrolled diabetes.  A weakened defense (immune) system. What increases the risk? This condition is more likely to develop in women who:  Smoke.  Use vaginal douches, scented tampons, or scented sanitary pads.  Wear tight-fitting pants.  Wear thong underwear.  Use oral birth control pills or an IUD.  Have sex without a condom.  Have multiple sex partners.  Have an STD.  Frequently use the spermicide nonoxynol-9.  Eat lots of foods high in sugar.  Have uncontrolled diabetes.  Have low estrogen levels.  Have a weakened immune system from an immune disorder or medical  treatment.  Are pregnant or breastfeeding. What are the signs or symptoms? Symptoms vary depending on the cause of the vaginitis. Common symptoms include:  Abnormal vaginal discharge. ? The discharge is white, gray, or yellow with bacterial vaginosis. ? The discharge is thick, white, and cheesy with a yeast infection. ? The discharge is frothy and yellow or greenish with trichomoniasis.  A bad vaginal smell. The smell is fishy with bacterial vaginosis.  Vaginal itching, pain, or swelling.  Sex that is painful.  Pain or burning when urinating. Sometimes there are no symptoms. How is this diagnosed? This condition is diagnosed based on your symptoms and medical history. A physical exam, including a pelvic exam, will also be done. You may also have other tests, including:  Tests to determine the pH level (acidity or alkalinity) of your vagina.  A whiff test, to assess the odor that results when a sample of your vaginal discharge is mixed with a potassium hydroxide solution.  Tests of vaginal fluid. A sample will be examined under a microscope. How is this treated? Treatment varies depending on the type of vaginitis you have. Your treatment may include:  Antibiotic creams or pills to treat bacterial vaginosis and trichomoniasis.  Antifungal medicines, such as vaginal creams or suppositories, to treat a yeast infection.  Medicine to ease discomfort if you have viral vaginitis. Your sexual partner should also be treated.  Estrogen delivered in a cream, pill, suppository, or vaginal ring to treat atrophic vaginitis. If vaginal dryness occurs, lubricants and moisturizing creams may help. You may need to avoid scented soaps, sprays, or douches.  Stopping use of a product that is causing allergic vaginitis. Then using a vaginal cream to treat the symptoms. Follow   these instructions at home: Lifestyle  Keep your genital area clean and dry. Avoid soap, and only rinse the area with  water.  Do not douche or use tampons until your health care provider says it is okay to do so. Use sanitary pads, if needed.  Do not have sex until your health care provider approves. When you can return to sex, practice safe sex and use condoms.  Wipe from front to back. This avoids the spread of bacteria from the rectum to the vagina. General instructions  Take over-the-counter and prescription medicines only as told by your health care provider.  If you were prescribed an antibiotic medicine, take or use it as told by your health care provider. Do not stop taking or using the antibiotic even if you start to feel better.  Keep all follow-up visits as told by your health care provider. This is important. How is this prevented?  Use mild, non-scented products. Do not use things that can irritate the vagina, such as fabric softeners. Avoid the following products if they are scented: ? Feminine sprays. ? Detergents. ? Tampons. ? Feminine hygiene products. ? Soaps or bubble baths.  Let air reach your genital area. ? Wear cotton underwear to reduce moisture buildup. ? Avoid wearing underwear while you sleep. ? Avoid wearing tight pants and underwear or nylons without a cotton panel. ? Avoid wearing thong underwear.  Take off any wet clothing, such as bathing suits, as soon as possible.  Practice safe sex and use condoms. Contact a health care provider if:  You have abdominal pain.  You have a fever.  You have symptoms that last for more than 2-3 days. Get help right away if:  You have a fever and your symptoms suddenly get worse. Summary  Vaginitis is a condition in which the vaginal tissue becomes inflamed.This condition is most often caused by a change in the normal balance of bacteria and yeast that live in the vagina.  Treatment varies depending on the type of vaginitis you have.  Do not douche, use tampons , or have sex until your health care provider approves. When  you can return to sex, practice safe sex and use condoms. This information is not intended to replace advice given to you by your health care provider. Make sure you discuss any questions you have with your health care provider. Document Released: 02/27/2007 Document Revised: 04/14/2017 Document Reviewed: 06/07/2016 Elsevier Patient Education  2020 Elsevier Inc.  

## 2018-11-21 LAB — CERVICOVAGINAL ANCILLARY ONLY
Bacterial vaginitis: POSITIVE — AB
Candida vaginitis: POSITIVE — AB

## 2018-11-22 ENCOUNTER — Encounter: Payer: Self-pay | Admitting: Advanced Practice Midwife

## 2018-11-22 DIAGNOSIS — B3731 Acute candidiasis of vulva and vagina: Secondary | ICD-10-CM | POA: Insufficient documentation

## 2018-11-22 DIAGNOSIS — B373 Candidiasis of vulva and vagina: Secondary | ICD-10-CM | POA: Insufficient documentation

## 2018-11-22 MED ORDER — TERCONAZOLE 0.4 % VA CREA: 1.0000 | TOPICAL_CREAM | Freq: Every day | VAGINAL | 0 refills | Status: DC

## 2018-11-22 NOTE — Progress Notes (Signed)
GYNECOLOGY CLINIC ENCOUNTER NOTE  Subjective:   Angela Shah is a 43 y.o. [phone] female here for a complaint of vaginal itchng and irritation , which caused her vagina to be swollen.   Denies lesions or burning.     Denies abnormal vaginal bleeding, pelvic pain, problems with intercourse or other gynecologic concerns.    Gynecologic History No LMP recorded. Patient has had an injection. Contraception: Depo-Provera injections Last Pap: 2018. Results were: normal   Obstetric History OB History  Gravida Para Term Preterm AB Living  4 4 2 2   4   SAB TAB Ectopic Multiple Live Births               # Outcome Date GA Lbr Len/2nd Weight Sex Delivery Anes PTL Lv  4 Preterm           3 Preterm           2 Term           1 Term             Past Medical History:  Diagnosis Date  . Clotting disorder ([place])   . DVT (deep venous thrombosis) ([place])     Past Surgical History:  Procedure Laterality Date  . Ring tubal ligation      Current Outpatient Medications on File Prior to Visit  Medication Sig Dispense Refill  . acetaminophen (TYLENOL) 500 MG tablet Take 2 tablets (1,000 mg total) by mouth 2 (two) times daily. 30 tablet 0  . albuterol (VENTOLIN HFA) 108 (90 Base) MCG/ACT inhaler Inhale 2 puffs into the lungs every 6 (six) hours as needed for wheezing or shortness of breath. 1 Inhaler 0  . aspirin EC 81 MG tablet Take 1 tablet (81 mg total) by mouth daily. 90 tablet 3  . hydrochlorothiazide (HYDRODIURIL) 12.5 MG tablet Take 1 daily as needed for leg swelling- use just when needed 30 tablet 3  . medroxyPROGESTERone (DEPO-PROVERA) 150 MG/ML injection Inject 1 mL (150 mg total) into the muscle every 3 (three) months. 1 mL 2  . megestrol (MEGACE) 40 MG tablet TAKE 2 TABLETS BY MOUTH TWICE DAILY IN THE EVENT OF HEAVY BLEEDING 120 tablet 5  . amoxicillin-clavulanate (AUGMENTIN) 875-125 MG tablet Take 1 tablet by mouth 2 (two) times daily. (Patient not taking: Reported on 11/20/2018) 20  tablet 0  . megestrol (MEGACE) 40 MG tablet Take 40 mg by mouth daily.    . predniSONE (DELTASONE) 10 MG tablet 3 tabs x 2 days, 2 tabs x 2 days, 1 tab x 2 days (Patient not taking: Reported on 11/20/2018) 12 tablet 0   Current Facility-Administered Medications on File Prior to Visit  Medication Dose Route Frequency Provider Last Rate Last Dose  . medroxyPROGESTERone (DEPO-PROVERA) injection 150 mg  150 mg Intramuscular Q90 days Truett Mainland, DO   150 mg at 07/10/18 1123    Allergies  Allergen Reactions  . Iodinated Diagnostic Agents Hives  . Rivaroxaban Hives    Also causes headache    Social History   Socioeconomic History  . Marital status: Single    Spouse name: Not on file  . Number of children: Not on file  . Years of education: Not on file  . Highest education level: Not on file  Occupational History  . Not on file  Social Needs  . Financial resource strain: Not on file  . Food insecurity    Worry: Not on file    Inability: Not on file  .  Transportation needs    Medical: Not on file    Non-medical: Not on file  Tobacco Use  . Smoking status: Never Smoker  . Smokeless tobacco: Never Used  Substance and Sexual Activity  . Alcohol use: No  . Drug use: No  . Sexual activity: Yes    Birth control/protection: Surgical, Injection  Lifestyle  . Physical activity    Days per week: Not on file    Minutes per session: Not on file  . Stress: Not on file  Relationships  . Social Herbalist on phone: Not on file    Gets together: Not on file    Attends religious service: Not on file    Active member of club or organization: Not on file    Attends meetings of clubs or organizations: Not on file    Relationship status: Not on file  . Intimate partner violence    Fear of current or ex partner: Not on file    Emotionally abused: Not on file    Physically abused: Not on file    Forced sexual activity: Not on file  Other Topics Concern  . Not on file   Social History Narrative  . Not on file    Family History  Problem Relation Age of Onset  . Heart disease Mother   . Cancer Sister        Ovarian cancer  . Cancer Maternal Grandmother        Lung cancer    The following portions of the patient's history were reviewed and updated as appropriate: allergies, current medications, past family history, past medical history, past social history, past surgical history and problem list.  Review of Systems Pertinent items noted in HPI and remainder of comprehensive ROS otherwise negative.   Objective:  BP 119/80   Pulse 79   Ht 5' 4.5" (1.638 m)   Wt 81.2 kg   BMI 30.27 kg/m  CONSTITUTIONAL: Well-developed, well-nourished female in no acute distress.  [place]: Alert and oriented PSYCHIATRIC: Normal mood and affect.  CARDIOVASCULAR: Normal heart rate noted RESPIRATORY: Normal effort ABDOMEN: Soft, No tenderness, rebound or guarding.  PELVIC: Normal appearing external genitalia;  There is erethema and mild swelling.  White discharge.  Bimanual exam deferred secondary to discomfort in vagina.   Assessment:  Vaginitis, likely yeast   Plan:  Will follow up results and manage accordingly. Rx Terazol 7 sent to pharmacy for yeast Routine preventative health maintenance measures emphasized. Please refer to After Visit Summary for other counseling recommendations.   [place] for [employer]

## 2018-12-19 ENCOUNTER — Encounter: Payer: Self-pay | Admitting: Family Medicine

## 2018-12-20 ENCOUNTER — Ambulatory Visit (INDEPENDENT_AMBULATORY_CARE_PROVIDER_SITE_OTHER): Payer: Managed Care, Other (non HMO)

## 2018-12-20 ENCOUNTER — Other Ambulatory Visit: Payer: Self-pay

## 2018-12-20 VITALS — BP 123/86 | HR 86 | Ht 64.5 in | Wt 178.0 lb

## 2018-12-20 DIAGNOSIS — Z3042 Encounter for surveillance of injectable contraceptive: Secondary | ICD-10-CM

## 2018-12-20 MED ORDER — MEDROXYPROGESTERONE ACETATE 150 MG/ML IM SUSP: 150.0000 mg | INTRAMUSCULAR | 3 refills | Status: DC

## 2018-12-20 MED FILL — medroxyPROGESTERone ACETATE: 150 | 90 days supply | Qty: 1 | Fill #0

## 2018-12-20 NOTE — Progress Notes (Addendum)
Angela Shah here for Depo-Provera  Injection.  Injection administered without complication. Patient will return in 3 months for next injection.  Sharicka Pogorzelski l Sion Thane, CMA 12/20/2018  8:43 AM  Attestation of Attending Supervision of RN: Evaluation and management procedures were performed by the nurse under my supervision and collaboration.  I have reviewed the nursing note and chart, and I agree with the management and plan.  Carolyn L. Harraway-Smith, M.D., Cherlynn June

## 2019-01-28 ENCOUNTER — Inpatient Hospital Stay (HOSPITAL_BASED_OUTPATIENT_CLINIC_OR_DEPARTMENT_OTHER): Payer: Managed Care, Other (non HMO) | Admitting: Family

## 2019-01-28 ENCOUNTER — Encounter: Payer: Self-pay | Admitting: Family

## 2019-01-28 ENCOUNTER — Other Ambulatory Visit: Payer: Self-pay

## 2019-01-28 ENCOUNTER — Telehealth: Payer: Self-pay | Admitting: Family

## 2019-01-28 ENCOUNTER — Inpatient Hospital Stay: Payer: Managed Care, Other (non HMO) | Attending: Hematology & Oncology

## 2019-01-28 VITALS — BP 121/88 | HR 76 | Temp 98.0°F | Resp 18 | Ht 64.0 in | Wt 177.0 lb

## 2019-01-28 DIAGNOSIS — Z7982 Long term (current) use of aspirin: Secondary | ICD-10-CM | POA: Insufficient documentation

## 2019-01-28 DIAGNOSIS — R51 Headache: Secondary | ICD-10-CM | POA: Insufficient documentation

## 2019-01-28 DIAGNOSIS — Z79899 Other long term (current) drug therapy: Secondary | ICD-10-CM | POA: Insufficient documentation

## 2019-01-28 DIAGNOSIS — I808 Phlebitis and thrombophlebitis of other sites: Secondary | ICD-10-CM

## 2019-01-28 DIAGNOSIS — Z86718 Personal history of other venous thrombosis and embolism: Secondary | ICD-10-CM

## 2019-01-28 LAB — CMP (CANCER CENTER ONLY)
ALT: 13 U/L (ref 0–44)
AST: 13 U/L — ABNORMAL LOW (ref 15–41)
Albumin: 3.9 g/dL (ref 3.5–5.0)
Alkaline Phosphatase: 80 U/L (ref 38–126)
Anion gap: 7 (ref 5–15)
BUN: 7 mg/dL (ref 6–20)
CO2: 24 mmol/L (ref 22–32)
Calcium: 9.2 mg/dL (ref 8.9–10.3)
Chloride: 107 mmol/L (ref 98–111)
Creatinine: 0.88 mg/dL (ref 0.44–1.00)
GFR, Est AFR Am: >60 mL/min (ref >60)
GFR, Estimated: >60 mL/min (ref >60)
Glucose, Bld: 85 mg/dL (ref 70–99)
Potassium: 3.6 mmol/L (ref 3.5–5.1)
Sodium: 138 mmol/L (ref 135–145)
Total Bilirubin: 0.5 mg/dL (ref 0.3–1.2)
Total Protein: 6.6 g/dL (ref 6.5–8.1)

## 2019-01-28 LAB — CBC WITH DIFFERENTIAL (CANCER CENTER ONLY)
Abs Immature Granulocytes: 0.01 10*3/uL (ref 0.00–0.07)
Basophils Absolute: 0 10*3/uL (ref 0.0–0.1)
Basophils Relative: 0 %
Eosinophils Absolute: 0.1 10*3/uL (ref 0.0–0.5)
Eosinophils Relative: 1 %
HCT: 42.2 % (ref 36.0–46.0)
Hemoglobin: 13.3 g/dL (ref 12.0–15.0)
Immature Granulocytes: 0 %
Lymphocytes Relative: 50 %
Lymphs Abs: 3.2 10*3/uL (ref 0.7–4.0)
MCH: 23.6 pg — ABNORMAL LOW (ref 26.0–34.0)
MCHC: 31.5 g/dL (ref 30.0–36.0)
MCV: 75 fL — ABNORMAL LOW (ref 80.0–100.0)
Monocytes Absolute: 0.5 10*3/uL (ref 0.1–1.0)
Monocytes Relative: 8 %
Neutro Abs: 2.6 10*3/uL (ref 1.7–7.7)
Neutrophils Relative %: 41 %
Platelet Count: 301 10*3/uL (ref 150–400)
RBC: 5.63 MIL/uL — ABNORMAL HIGH (ref 3.87–5.11)
RDW: 14.6 % (ref 11.5–15.5)
WBC Count: 6.5 10*3/uL (ref 4.0–10.5)
nRBC: 0 % (ref 0.0–0.2)

## 2019-01-28 NOTE — Progress Notes (Signed)
Hematology and Oncology Follow Up Visit  Angela Shah [id number] 11/02/1975 43 y.o. 01/28/2019   Principle Diagnosis:  History DVT of popliteal vein in left lower extremity Superficial thrombus of the right upper medial arm  Current Therapy:   Aspirin 81 mg PO daily   Interim History:  Angela Shah is here today for follow-up. She is doing fairly well but has had a consistent headache for a while now. No blurred or loss of vision. She has contacted her PCP and is waiting to scheduled a CT of the head for further evaluation.  No issues on aspirin. She is taking 162 mg PO daily as prescribed.  No episodes of bleeding, no bruising or petechiae.  No fever, chills, n/v, cough, rash, dizziness, SOB, chest pain, palpitations, abdominal pain or changes in bowel or bladder habits.  No swelling or tenderness in her extremities.  She has occasional positional numbness and tingling in her arms and hands if she sleeps a certain way.  No falls or syncopal episodes. She has maintained a good appetite and is staying well hydrated. Her weight is stable.   ECOG Performance Status: 1 - Symptomatic but completely ambulatory  Medications:  Allergies as of 01/28/2019      Reactions   Iodinated Diagnostic Agents Hives   Rivaroxaban Hives   Also causes headache      Medication List       Accurate as of January 28, 2019 12:23 PM. If you have any questions, ask your nurse or doctor.        STOP taking these medications   amoxicillin-clavulanate 875-125 MG tablet Commonly known as: Augmentin Stopped by: Laverna Peace, NP   predniSONE 10 MG tablet Commonly known as: DELTASONE Stopped by: Laverna Peace, NP   terconazole 0.4 % vaginal cream Commonly known as: TERAZOL 7 Stopped by: Laverna Peace, NP     TAKE these medications   acetaminophen 500 MG tablet Commonly known as: TYLENOL Take 2 tablets (1,000 mg total) by mouth 2 (two) times daily.   albuterol 108 (90 Base)  MCG/ACT inhaler Commonly known as: VENTOLIN HFA Inhale 2 puffs into the lungs every 6 (six) hours as needed for wheezing or shortness of breath.   aspirin EC 81 MG tablet Take 1 tablet (81 mg total) by mouth daily.   hydrochlorothiazide 12.5 MG tablet Commonly known as: HYDRODIURIL Take 1 daily as needed for leg swelling- use just when needed   medroxyPROGESTERone 150 MG/ML injection Commonly known as: DEPO-PROVERA Inject 1 mL (150 mg total) into the muscle every 3 (three) months. What changed: Another medication with the same name was removed. Continue taking this medication, and follow the directions you see here. Changed by: Laverna Peace, NP   megestrol 40 MG tablet Commonly known as: MEGACE TAKE 2 TABLETS BY MOUTH TWICE DAILY IN THE EVENT OF HEAVY BLEEDING What changed: Another medication with the same name was removed. Continue taking this medication, and follow the directions you see here. Changed by: Laverna Peace, NP       Allergies:  Allergies  Allergen Reactions  . Iodinated Diagnostic Agents Hives  . Rivaroxaban Hives    Also causes headache    Past Medical History, Surgical history, Social history, and Family History were reviewed and updated.  Review of Systems: All other 10 point review of systems is negative.   Physical Exam:  height is 5\' 4"  (1.626 m) and weight is 177 lb (80.3 kg). Her temporal temperature is 98 F (36.7 C). Her  blood pressure is 121/88 and her pulse is 76. Her respiration is 18 and oxygen saturation is 100%.   Wt Readings from Last 3 Encounters:  01/28/19 177 lb (80.3 kg)  12/20/18 178 lb (80.7 kg)  11/20/18 179 lb 1.9 oz (81.2 kg)    Ocular: Sclerae unicteric, pupils equal, round and reactive to light Ear-nose-throat: Oropharynx clear, dentition fair Lymphatic: No cervical or supraclavicular adenopathy Lungs no rales or rhonchi, good excursion bilaterally Heart regular rate and rhythm, no murmur appreciated Abd soft,  nontender, positive bowel sounds, no liver or spleen tip palpated on exam, no fluid wave  MSK no focal spinal tenderness, no joint edema Neuro: non-focal, well-oriented, appropriate affect Breasts: Deferred   Lab Results  Component Value Date   WBC 6.5 01/28/2019   HGB 13.3 01/28/2019   HCT 42.2 01/28/2019   MCV 75.0 (L) 01/28/2019   PLT 301 01/28/2019   Lab Results  Component Value Date   FERRITIN 12 06/18/2018   IRON 55 06/18/2018   TIBC 324 06/18/2018   UIBC 269 06/18/2018   IRONPCTSAT 17 (L) 06/18/2018   Lab Results  Component Value Date   RETICCTPCT 1.5 06/18/2018   RBC 5.63 (H) 01/28/2019   No results found for: KPAFRELGTCHN, LAMBDASER, KAPLAMBRATIO No results found for: IGGSERUM, IGA, IGMSERUM No results found for: Ronnald Ramp, A1GS, A2GS, Tillman Sers, SPEI   Chemistry      Component Value Date/Time   NA 138 01/28/2019 1133   K 3.6 01/28/2019 1133   CL 107 01/28/2019 1133   CO2 24 01/28/2019 1133   BUN 7 01/28/2019 1133   CREATININE 0.88 01/28/2019 1133      Component Value Date/Time   CALCIUM 9.2 01/28/2019 1133   ALKPHOS 80 01/28/2019 1133   AST 13 (L) 01/28/2019 1133   ALT 13 01/28/2019 1133   BILITOT 0.5 01/28/2019 1133       Impression and Plan: Angela Shah is a very pleasant 43 yo African American female with history of DVT in the left lower extremity diagnosed in 2011 treated with 9 months of Coumadin and now a superficial thrombus of the right upper medial arm. She has a strong family history of thrombus on her mother's side.  She will continue her 2 baby aspirin daily.  We will continue to follow along with her and see her back in another 6 months.  She will contact our office with any questions or concerns. We can certainly see her sooner if needed.   Laverna Peace, NP 9/14/202012:23 PM

## 2019-01-28 NOTE — Telephone Encounter (Signed)
Called and spoke with patient regarding appointments added per 9/14 los °

## 2019-03-04 ENCOUNTER — Encounter: Payer: Self-pay | Admitting: Family Medicine

## 2019-03-05 NOTE — Progress Notes (Signed)
[place] at [facility] [address] [place], [place] [address] [phone]  Date:  03/06/2019   Name:  Angela Shah   DOB:  09-23-1975   MRN:  [id number]  PCP:  Darreld Mclean, MD    Chief Complaint: Back Pain (written out of work, muscle strain and tendon of lower back, given muscle relaxor)   History of Present Illness:  Angela Shah is a 43 y.o. very pleasant female patient who presents with the following:  Patient with history of DVT, otherwise generally healthy.   Her clotting issues are managed by oncology, she is taking 2 baby aspirin daily currently Last seen by myself for virtual visit in June, with concern of sinusitis  Here today for a back injury She works at [employer]- her job involves a lot of lifting  She hurt her back at work recently pulling a very heavy drum out from under a countertop-it was a 120 gallon drum, we estimate it might weigh up to 500 pounds..  She tried to stand back up after pulling on the drum and had shooting pains up her back and legs She went to [facility] for this WC issue- they treated her but did not give her an out of work note.  They gave her a muscle relaxer and anti-inflammatory These do help, but she still has pain Never had back trouble in the past  Her job does not have light duty.  Per patient, her supervisor encouraged her to be seen and to be excused from work for at least a week due to her injury She is really not able to perform her job at this time, as there is no light duty available  The pain runs down her legs bilaterally - about equal bilaterally  No numbness or weakness of her legs, no bowel or bladder dysfunction She notes pain and spasm in her lumbar muscles  She is otherwise feeling well and normal No fever or chills  Flu shot- she declines today  Pap up-to-date  Patient Active Problem List   Diagnosis Date Noted  . Yeast vaginitis  11/22/2018  . Superficial thrombophlebitis 07/11/2018  . Fibroids, subserous 08/21/2017  . History of DVT (deep vein thrombosis) 08/21/2017  . Dysmenorrhea 09/12/2016    Past Medical History:  Diagnosis Date  . Clotting disorder ([place])   . DVT (deep venous thrombosis) ([place])     Past Surgical History:  Procedure Laterality Date  . Ring tubal ligation      Social History   Tobacco Use  . Smoking status: Never Smoker  . Smokeless tobacco: Never Used  Substance Use Topics  . Alcohol use: No  . Drug use: No    Family History  Problem Relation Age of Onset  . Heart disease Mother   . Cancer Sister        Ovarian cancer  . Cancer Maternal Grandmother        Lung cancer    Allergies  Allergen Reactions  . Iodinated Diagnostic Agents Hives  . Rivaroxaban Hives    Also causes headache    Medication list has been reviewed and updated.  Current Outpatient Medications on File Prior to Visit  Medication Sig Dispense Refill  . acetaminophen (TYLENOL) 500 MG tablet Take 2 tablets (1,000 mg total) by mouth 2 (two) times daily. 30 tablet 0  . albuterol (VENTOLIN HFA) 108 (90 Base) MCG/ACT inhaler Inhale 2 puffs into the  lungs every 6 (six) hours as needed for wheezing or shortness of breath. 1 Inhaler 0  . aspirin EC 81 MG tablet Take 1 tablet (81 mg total) by mouth daily. 90 tablet 3  . diclofenac (VOLTAREN) 75 MG EC tablet Take 75 mg by mouth 2 (two) times daily.    . hydrochlorothiazide (HYDRODIURIL) 12.5 MG tablet Take 1 daily as needed for leg swelling- use just when needed 30 tablet 3  . medroxyPROGESTERone (DEPO-PROVERA) 150 MG/ML injection Inject 1 mL (150 mg total) into the muscle every 3 (three) months. 1 mL 3  . megestrol (MEGACE) 40 MG tablet TAKE 2 TABLETS BY MOUTH TWICE DAILY IN THE EVENT OF HEAVY BLEEDING 120 tablet 5  . methocarbamol (ROBAXIN) 500 MG tablet SMARTSIG:1 Tablet(s) By Mouth Every 8-12 Hours PRN     No current facility-administered medications on file  prior to visit.     Review of Systems:  As per HPI- otherwise negative.  Physical Examination: Vitals:   03/06/19 1431  BP: 128/82  Pulse: 82  Resp: 16  Temp: (!) 97.5 F (36.4 C)  SpO2: 98%   Vitals:   03/06/19 1431  Weight: 175 lb (79.4 kg)  Height: 5\' 4"  (1.626 m)   Body mass index is 30.04 kg/m. Ideal Body Weight: Weight in (lb) to have BMI = 25: 145.3  GEN: WDWN, NAD, Non-toxic, A & O x 3, overweight, appears well HEENT: Atraumatic, Normocephalic. Neck supple. No masses, No LAD. Ears and Nose: No external deformity. CV: RRR, No M/G/R. No JVD. No thrill. No extra heart sounds. PULM: CTA B, no wheezes, crackles, rhonchi. No retractions. No resp. distress. No accessory muscle use. ABD: S, NT, ND EXTR: No c/c/e NEURO Normal gait.  PSYCH: Normally interactive. Conversant. Not depressed or anxious appearing.  Calm demeanor.  She notes muscular tenderness from the thoracolumbar junction down to her sacrum.  She is tender in the muscles on both sides of her spine, spasm is present.  No bony tenderness flexion is severely limited due to pain, extension is more normal.  Strength in her bilateral lower extremities is slightly limited due to pain.  Negative straight leg raise, sensation and DTR normal bilaterally   Assessment and Plan: Strain of lumbar region, subsequent encounter  Here today with a lumbar spine strain which occurred when she tried to move a very heavy object 2 days ago.  She is using Flexeril and Robaxin per urgent care, has noticed some improvement but still is having pain and is unable to lift. I have encouraged her to use relative rest, gentle activity,gentle stretching, heat and medications as needed  She will return to work in the next week to 10 days depending on how quickly she recovers.  I have asked her to contact me if she is getting worse or has any other concerns  Signed Lamar Blinks, MD

## 2019-03-06 ENCOUNTER — Encounter: Payer: Self-pay | Admitting: Family Medicine

## 2019-03-06 ENCOUNTER — Ambulatory Visit (INDEPENDENT_AMBULATORY_CARE_PROVIDER_SITE_OTHER): Payer: Managed Care, Other (non HMO) | Admitting: Family Medicine

## 2019-03-06 ENCOUNTER — Other Ambulatory Visit: Payer: Self-pay

## 2019-03-06 VITALS — BP 128/82 | HR 82 | Temp 97.5°F | Resp 16 | Ht 64.0 in | Wt 175.0 lb

## 2019-03-06 DIAGNOSIS — S39012D Strain of muscle, fascia and tendon of lower back, subsequent encounter: Secondary | ICD-10-CM | POA: Diagnosis not present

## 2019-03-06 NOTE — Patient Instructions (Signed)
Good to see you today- I am sorry that you got hurt!  Please continue your robaxin and diclofenac per the urgent care Rest, but gentle activity will help avoid stiffness Heat, gentle stretching can help  You can return to work next week, or not until 11/2 depending on your progress  let me know if any change or worsening

## 2019-05-29 ENCOUNTER — Other Ambulatory Visit: Payer: Self-pay

## 2019-05-29 ENCOUNTER — Ambulatory Visit (INDEPENDENT_AMBULATORY_CARE_PROVIDER_SITE_OTHER): Payer: Managed Care, Other (non HMO) | Admitting: Medical

## 2019-05-29 ENCOUNTER — Encounter: Payer: Self-pay | Admitting: Medical

## 2019-05-29 VITALS — Temp 100.7°F | Ht 64.0 in | Wt 174.0 lb

## 2019-05-29 DIAGNOSIS — M791 Myalgia, unspecified site: Secondary | ICD-10-CM | POA: Diagnosis not present

## 2019-05-29 DIAGNOSIS — R509 Fever, unspecified: Secondary | ICD-10-CM | POA: Diagnosis not present

## 2019-05-29 DIAGNOSIS — R5383 Other fatigue: Secondary | ICD-10-CM

## 2019-05-29 DIAGNOSIS — R059 Cough, unspecified: Secondary | ICD-10-CM

## 2019-05-29 DIAGNOSIS — R062 Wheezing: Secondary | ICD-10-CM

## 2019-05-29 DIAGNOSIS — R05 Cough: Secondary | ICD-10-CM

## 2019-05-29 MED ORDER — FLUTICASONE PROPIONATE 50 MCG/ACT NA SUSP: 2.0000 | Freq: Every day | NASAL | 1 refills | Status: DC

## 2019-05-29 MED ORDER — ALBUTEROL SULFATE HFA 108 (90 BASE) MCG/ACT IN AERS: 2.0000 | INHALATION_SPRAY | Freq: Four times a day (QID) | RESPIRATORY_TRACT | 0 refills | Status: AC | PRN

## 2019-05-29 MED ORDER — BENZONATATE 100 MG PO CAPS: 100.0000 mg | ORAL_CAPSULE | Freq: Three times a day (TID) | ORAL | 0 refills | Status: DC | PRN

## 2019-05-29 NOTE — Patient Instructions (Addendum)
You have 1 day of various symptoms including nasal congestion, cough, fever, body aches, headache and very fatigued.  All of these symptoms cause some concern for potential early Covid infection.  I do think it is a good idea for you to go ahead and get scheduled for Covid testing is up to [facility] location or local pharmacy.  I explained to patient getting tested tomorrow might be early and could potentially to a false negative test.  I recommended getting tested you this weekend or on Monday.  Either way advised patient to update [place] on test results as well as how she is doing at the time of test results.  During the interim advised to quarantine and sent work excuse note to my chart.  Did make benzonatate available for cough.  Albuterol available for wheezing or shortness of breath.  Flonase prescription for nasal congestion.  If signs symptoms worsen or change notify us.  Follow-up date pending Covid test results and depending on update on clinical condition.

## 2019-05-29 NOTE — Progress Notes (Signed)
   Subjective:    Patient ID: Angela Shah, female    DOB: 12/09/1975, 44 y.o.   MRN: [id number]  HPI  Virtual Visit via Video Note  I connected with Angela Shah on 05/29/19 at  3:40 PM EST by a video enabled telemedicine application and verified that I am speaking with the correct person using two identifiers.  Pt has no bp cuff. Her bp always run good on review.  Location: Patient: home Provider: office.   I discussed the limitations of evaluation and management by telemedicine and the availability of in person appointments. The patient expressed understanding and agreed to proceed.  History of Present Illness:   Pt has one day with mild  nasal congestion, cough, fever, body aches, ha  and severe fatigue.  No nausea, no vomiting, no diarrhea, no sob  and no loss of smell.  Pt states mild wheezing.  Pt states no asthma. But last time got sick did get inhaler. She does not know where it is. Used inhaler couple of months ago.  Pt appetite is decreased.   Pt works in [employer]. 6 people. Separated.     Observations/Objective: General-no acute distress, pleasant, oriented. Lungs- on inspection lungs appear unlabored. Neck- no tracheal deviation or jvd on inspection. Neuro- gross motor function appears intact.  Assessment and Plan: You have 1 day of various symptoms including nasal congestion, cough, fever, body aches, headache and very fatigued.  All of these symptoms cause some concern for potential early Covid infection.  I do think it is a good idea for you to go ahead and get scheduled for Covid testing is up to [facility] location or local pharmacy.  I explained to patient getting tested tomorrow might be early and could potentially to a false negative test.  I recommended getting tested you this weekend or on Monday.  Either way advised patient to update [place] on test results as well as how she is doing at the time of test results.  During the interim advised  to quarantine and sent work excuse note to my chart.  Did make benzonatate available for cough.  Albuterol available for wheezing or shortness of breath.  Flonase prescription for nasal congestion.  If signs symptoms worsen or change notify us.  Follow-up date pending Covid test results and depending on update on clinical condition.  Follow Up Instructions:    I discussed the assessment and treatment plan with the patient. The patient was provided an opportunity to ask questions and all were answered. The patient agreed with the plan and demonstrated an understanding of the instructions.   The patient was advised to call back or seek an in-person evaluation if the symptoms worsen or if the condition fails to improve as anticipated.  I provided 20 minutes of non-face-to-face time during this encounter.   Mackie Pai, PA-C   Review of Systems     Objective:   Physical Exam        Assessment & Plan:

## 2019-06-06 ENCOUNTER — Encounter: Payer: Self-pay | Admitting: Medical

## 2019-07-10 ENCOUNTER — Ambulatory Visit (INDEPENDENT_AMBULATORY_CARE_PROVIDER_SITE_OTHER): Payer: Managed Care, Other (non HMO)

## 2019-07-10 ENCOUNTER — Other Ambulatory Visit: Payer: Self-pay

## 2019-07-10 VITALS — BP 122/82 | HR 86 | Ht 64.0 in | Wt 172.0 lb

## 2019-07-10 DIAGNOSIS — Z3042 Encounter for surveillance of injectable contraceptive: Secondary | ICD-10-CM | POA: Diagnosis not present

## 2019-07-10 MED ORDER — MEDROXYPROGESTERONE ACETATE 150 MG/ML IM SUSP: 150.0000 mg | INTRAMUSCULAR | 0 refills | Status: DC

## 2019-07-10 MED ORDER — MEDROXYPROGESTERONE ACETATE 150 MG/ML IM SUSP
150.0000 mg | Freq: Once | INTRAMUSCULAR | Status: AC
  Administered 2019-07-10: 14:00:00 150 mg via INTRAMUSCULAR

## 2019-07-10 MED FILL — medroxyPROGESTERone ACETATE: 150 | 90 days supply | Qty: 1 | Fill #0

## 2019-07-10 NOTE — Progress Notes (Addendum)
Angela Shah here for Depo-Provera  Injection.  Injection administered without complication. Patient will return in 3 months for next injection.  [name] l Antron Seth, CMA 07/10/2019  1:24 PM  Attestation of Attending Supervision of CMA/RN: Evaluation and management procedures were performed by the nurse under my supervision and collaboration.  I have reviewed the nursing note and chart, and I agree with the management and plan.  Carolyn L. Harraway-Smith, M.D., Cherlynn June

## 2019-07-29 ENCOUNTER — Other Ambulatory Visit: Payer: Self-pay

## 2019-07-29 ENCOUNTER — Inpatient Hospital Stay: Payer: Managed Care, Other (non HMO) | Attending: Hematology & Oncology

## 2019-07-29 ENCOUNTER — Inpatient Hospital Stay (HOSPITAL_BASED_OUTPATIENT_CLINIC_OR_DEPARTMENT_OTHER): Payer: Managed Care, Other (non HMO) | Admitting: Hematology & Oncology

## 2019-07-29 ENCOUNTER — Ambulatory Visit (HOSPITAL_BASED_OUTPATIENT_CLINIC_OR_DEPARTMENT_OTHER)
Admission: RE | Admit: 2019-07-29 | Discharge: 2019-07-29 | Disposition: A | Discharge status: Home / Self Care | Payer: Managed Care, Other (non HMO) | Source: Ambulatory Visit | Attending: Hematology & Oncology | Admitting: Hematology & Oncology

## 2019-07-29 VITALS — BP 132/87 | HR 77 | Temp 96.9°F | Resp 16 | Wt 173.0 lb

## 2019-07-29 DIAGNOSIS — Z86718 Personal history of other venous thrombosis and embolism: Secondary | ICD-10-CM

## 2019-07-29 DIAGNOSIS — I808 Phlebitis and thrombophlebitis of other sites: Secondary | ICD-10-CM

## 2019-07-29 LAB — CMP (CANCER CENTER ONLY)
ALT: 12 U/L (ref 0–44)
AST: 11 U/L — ABNORMAL LOW (ref 15–41)
Albumin: 3.9 g/dL (ref 3.5–5.0)
Alkaline Phosphatase: 68 U/L (ref 38–126)
Anion gap: 5 (ref 5–15)
BUN: 14 mg/dL (ref 6–20)
CO2: 27 mmol/L (ref 22–32)
Calcium: 9.2 mg/dL (ref 8.9–10.3)
Chloride: 107 mmol/L (ref 98–111)
Creatinine: 0.91 mg/dL (ref 0.44–1.00)
GFR, Est AFR Am: >60 mL/min (ref >60)
GFR, Estimated: >60 mL/min (ref >60)
Glucose, Bld: 90 mg/dL (ref 70–99)
Potassium: 4.3 mmol/L (ref 3.5–5.1)
Sodium: 139 mmol/L (ref 135–145)
Total Bilirubin: 0.3 mg/dL (ref 0.3–1.2)
Total Protein: 6.7 g/dL (ref 6.5–8.1)

## 2019-07-29 LAB — CBC WITH DIFFERENTIAL (CANCER CENTER ONLY)
Abs Immature Granulocytes: 0.01 10*3/uL (ref 0.00–0.07)
Basophils Absolute: 0 10*3/uL (ref 0.0–0.1)
Basophils Relative: 1 %
Eosinophils Absolute: 0.1 10*3/uL (ref 0.0–0.5)
Eosinophils Relative: 1 %
HCT: 41.4 % (ref 36.0–46.0)
Hemoglobin: 13.1 g/dL (ref 12.0–15.0)
Immature Granulocytes: 0 %
Lymphocytes Relative: 46 %
Lymphs Abs: 3.1 10*3/uL (ref 0.7–4.0)
MCH: 23.6 pg — ABNORMAL LOW (ref 26.0–34.0)
MCHC: 31.6 g/dL (ref 30.0–36.0)
MCV: 74.6 fL — ABNORMAL LOW (ref 80.0–100.0)
Monocytes Absolute: 0.5 10*3/uL (ref 0.1–1.0)
Monocytes Relative: 8 %
Neutro Abs: 2.9 10*3/uL (ref 1.7–7.7)
Neutrophils Relative %: 44 %
Platelet Count: 291 10*3/uL (ref 150–400)
RBC: 5.55 MIL/uL — ABNORMAL HIGH (ref 3.87–5.11)
RDW: 14.8 % (ref 11.5–15.5)
WBC Count: 6.6 10*3/uL (ref 4.0–10.5)
nRBC: 0 % (ref 0.0–0.2)

## 2019-07-29 LAB — D-DIMER, QUANTITATIVE: D-Dimer, Quant: 0.38 ug/mL-FEU (ref 0.00–0.50)

## 2019-07-29 NOTE — Progress Notes (Signed)
Hematology and Oncology Follow Up Visit  Angela Shah [id number] [birth year] 44 y.o. 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  Angela Shah has a few complaints.  Thankfully, I do not think anything is can be related to thromboembolic disease.  Angela Shah main complaint is that Angela Shah arms go numb on occasion.  I do believe that this is going to be some cervical spine issue.  We will go ahead and get a MRI of the cervical spine.  If there is a problem, then Angela Shah will probably need to see neurosurgery or orthopedic surgery..  Angela Shah also complained of pain in the left lower leg.  This is close to the knee.  Angela Shah has had a thrombus in the left leg before.  As such, we have to check for this.  Angela Shah is on aspirin.  Angela Shah is doing okay on baby aspirin.  Angela Shah does not have monthly cycles.  Angela Shah has had no cough or shortness of breath.  There is been no chest wall pain.  Angela Shah has had no weakness in the arms.  There is no swelling.  Overall, I would say performance status is ECOG 1.  Medications:  Allergies as of 07/29/2019      Reactions   Iodinated Diagnostic Agents Hives   Rivaroxaban Hives   Also causes headache      Medication List       Accurate as of July 29, 2019 10:26 AM. If you have any questions, ask your nurse or doctor.        STOP taking these medications   methocarbamol 500 MG tablet Commonly known as: ROBAXIN Stopped by: Volanda Napoleon, MD     TAKE these medications   acetaminophen 500 MG tablet Commonly known as: TYLENOL Take 2 tablets (1,000 mg total) by mouth 2 (two) times daily.   albuterol 108 (90 Base) MCG/ACT inhaler Commonly known as: VENTOLIN HFA Inhale 2 puffs into the lungs every 6 (six) hours as needed for wheezing or shortness of breath.   aspirin EC 81 MG  tablet Take 1 tablet (81 mg total) by mouth daily.   benzonatate 100 MG capsule Commonly known as: TESSALON Take 1 capsule (100 mg total) by mouth 3 (three) times daily as needed for cough.   diclofenac 75 MG EC tablet Commonly known as: VOLTAREN Take 75 mg by mouth 2 (two) times daily.   fluticasone 50 MCG/ACT nasal spray Commonly known as: FLONASE Place 2 sprays into both nostrils daily.   hydrochlorothiazide 12.5 MG tablet Commonly known as: HYDRODIURIL Take 1 daily as needed for leg swelling- use just when needed   medroxyPROGESTERone 150 MG/ML injection Commonly known as: DEPO-PROVERA Inject 1 mL (150 mg total) into the muscle every 3 (three) months. What changed: Another medication with the same name was removed. Continue taking this medication, and follow the directions you see here. Changed by: Volanda Napoleon, MD   megestrol 40 MG tablet Commonly known as: MEGACE TAKE 2 TABLETS BY MOUTH TWICE DAILY IN THE EVENT OF HEAVY BLEEDING       Allergies:  Allergies  Allergen Reactions  . Iodinated Diagnostic Agents Hives  . Rivaroxaban Hives    Also causes headache    Past Medical History, Surgical history, Social history, and Family History were reviewed  and updated.  Review of Systems: Review of Systems  Constitutional: Negative.   HENT: Negative.   Eyes: Negative.   Respiratory: Negative.   Cardiovascular: Negative.   Gastrointestinal: Negative.   Genitourinary: Negative.   Musculoskeletal: Positive for neck pain.  Skin: Negative.   Neurological: Positive for tingling.  Endo/Heme/Allergies: Negative.   Psychiatric/Behavioral: Negative.      Physical Exam:  weight is 173 lb (78.5 kg). Angela Shah temporal temperature is 96.9 F (36.1 C) (abnormal). Angela Shah blood pressure is 132/87 and Angela Shah pulse is 77. Angela Shah respiration is 16 and oxygen saturation is 100%.   Wt Readings from Last 3 Encounters:  07/29/19 173 lb (78.5 kg)  07/10/19 172 lb (78 kg)  05/29/19 174 lb  (78.9 kg)    Physical Exam Vitals reviewed.  HENT:     Head: Normocephalic and atraumatic.  Eyes:     Pupils: Pupils are equal, round, and reactive to light.  Cardiovascular:     Rate and Rhythm: Normal rate and regular rhythm.     Heart sounds: Normal heart sounds.  Pulmonary:     Effort: Pulmonary effort is normal.     Breath sounds: Normal breath sounds.  Abdominal:     General: Bowel sounds are normal.     Palpations: Abdomen is soft.  Musculoskeletal:        General: No tenderness or deformity. Normal range of motion.     Cervical back: Normal range of motion.  Lymphadenopathy:     Cervical: No cervical adenopathy.  Skin:    General: Skin is warm and dry.     Findings: No erythema or rash.  Neurological:     Mental Status: Angela Shah is alert and oriented to person, place, and time.  Psychiatric:        Behavior: Behavior normal.        Thought Content: Thought content normal.        Judgment: Judgment normal.      Lab Results  Component Value Date   WBC 6.6 07/29/2019   HGB 13.1 07/29/2019   HCT 41.4 07/29/2019   MCV 74.6 (L) 07/29/2019   PLT 291 07/29/2019   Lab Results  Component Value Date   FERRITIN 12 06/18/2018   IRON 55 06/18/2018   TIBC 324 06/18/2018   UIBC 269 06/18/2018   IRONPCTSAT 17 (L) 06/18/2018   Lab Results  Component Value Date   RETICCTPCT 1.5 06/18/2018   RBC 5.55 (H) 07/29/2019   No results found for: KPAFRELGTCHN, LAMBDASER, KAPLAMBRATIO No results found for: IGGSERUM, IGA, IGMSERUM No results found for: Odetta Pink, SPEI   Chemistry      Component Value Date/Time   NA 139 07/29/2019 0956   K 4.3 07/29/2019 0956   CL 107 07/29/2019 0956   CO2 27 07/29/2019 0956   BUN 14 07/29/2019 0956   CREATININE 0.91 07/29/2019 0956      Component Value Date/Time   CALCIUM 9.2 07/29/2019 0956   ALKPHOS 68 07/29/2019 0956   AST 11 (L) 07/29/2019 0956   ALT 12 07/29/2019 0956    BILITOT 0.3 07/29/2019 0956       Impression and Plan: Angela Shah is a very pleasant 44 yo African American female with history of DVT in the left lower extremity diagnosed in 2011 treated with 9 months of Coumadin.  Angela Shah then developed a superficial thrombus of the right upper medial arm. Angela Shah has a strong family history of thrombus on Angela Shah mother's  side. Angela Shah will continue Angela Shah 2 baby aspirin daily.   Again, I had to believe that the arms are related to a neurologic issue.  We will get the MRI of the cervical spine.  We will get this at [facility].  This is very close to where Angela Shah lives.  We will have to get Angela Shah back probably in 6 weeks or so just follow-up with the scans.  I will also get a Doppler of Angela Shah left leg.   Volanda Napoleon, MD 3/15/202110:26 AM

## 2019-07-30 ENCOUNTER — Encounter: Payer: Self-pay | Admitting: *Deleted

## 2019-08-03 ENCOUNTER — Ambulatory Visit (HOSPITAL_BASED_OUTPATIENT_CLINIC_OR_DEPARTMENT_OTHER): Payer: Managed Care, Other (non HMO)

## 2019-09-09 ENCOUNTER — Inpatient Hospital Stay: Payer: Managed Care, Other (non HMO)

## 2019-09-09 ENCOUNTER — Inpatient Hospital Stay: Payer: Managed Care, Other (non HMO) | Admitting: Hematology & Oncology

## 2019-09-27 ENCOUNTER — Inpatient Hospital Stay: Payer: Managed Care, Other (non HMO) | Admitting: Hematology & Oncology

## 2019-09-27 ENCOUNTER — Inpatient Hospital Stay: Payer: Managed Care, Other (non HMO) | Attending: Hematology & Oncology

## 2020-03-07 IMAGING — CR DG CHEST 2V
2 series · 2 of 2 positions shown · non-contrast
Comparison: 05/26/2016 and prior radiographs

CLINICAL DATA: Acute LEFT chest pain for 2 days.

EXAM:
CHEST - 2 VIEW

[w chest pa]
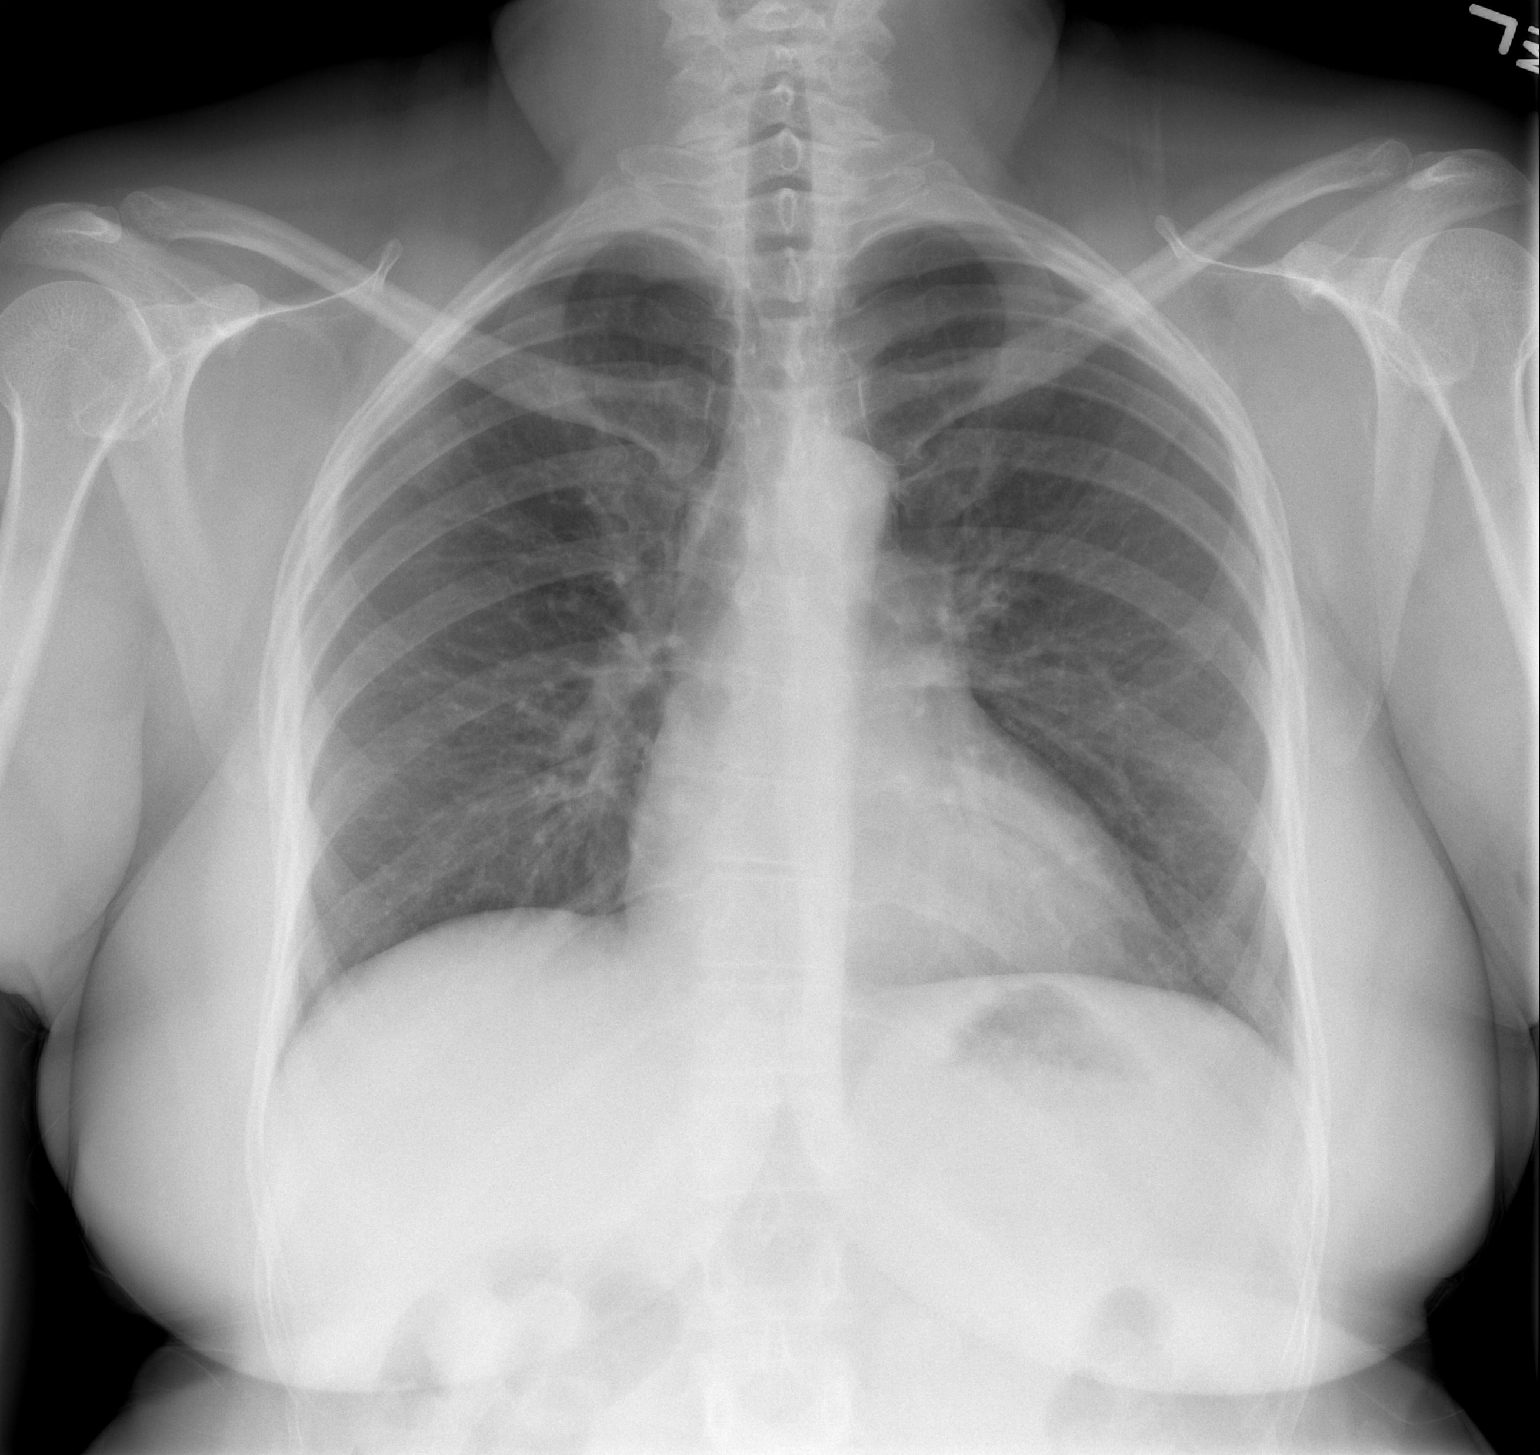

[w chest lat]
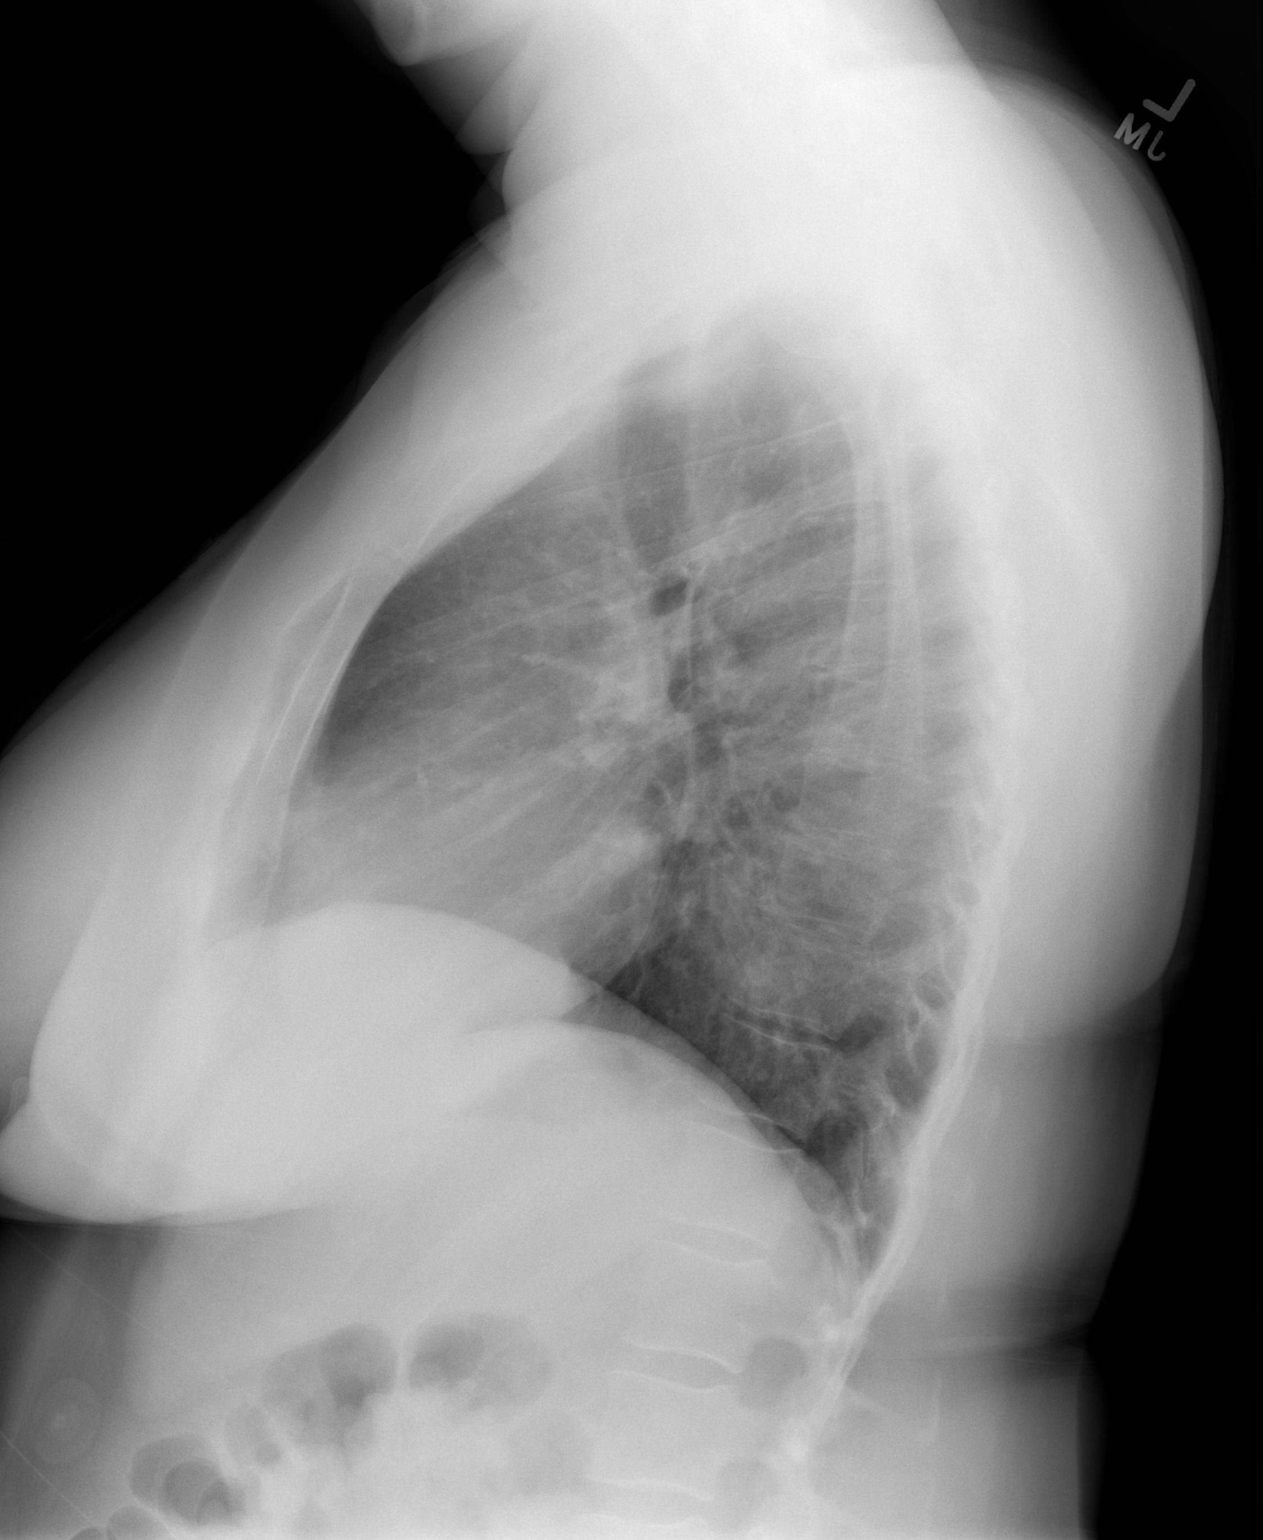

[2 of 2 positions shown; findings below may reference images not displayed]

FINDINGS: The cardiomediastinal silhouette is unremarkable.

There is no evidence of focal airspace disease, pulmonary edema,
suspicious pulmonary nodule/mass, pleural effusion, or pneumothorax.

No acute bony abnormalities are identified.
IMPRESSION: No active cardiopulmonary disease.

## 2020-05-06 ENCOUNTER — Ambulatory Visit: Payer: Managed Care, Other (non HMO) | Admitting: Family Medicine

## 2020-05-14 ENCOUNTER — Other Ambulatory Visit: Payer: Self-pay

## 2020-05-14 ENCOUNTER — Encounter: Payer: Self-pay | Admitting: Obstetrics & Gynecology

## 2020-05-14 ENCOUNTER — Ambulatory Visit (INDEPENDENT_AMBULATORY_CARE_PROVIDER_SITE_OTHER): Payer: PRIVATE HEALTH INSURANCE | Admitting: Obstetrics & Gynecology

## 2020-05-14 ENCOUNTER — Other Ambulatory Visit (HOSPITAL_COMMUNITY)
Admission: RE | Admit: 2020-05-14 | Discharge: 2020-05-14 | Disposition: A | Discharge status: Home / Self Care | Payer: Medicaid Other | Source: Ambulatory Visit | Attending: Obstetrics & Gynecology | Admitting: Obstetrics & Gynecology

## 2020-05-14 ENCOUNTER — Other Ambulatory Visit (HOSPITAL_BASED_OUTPATIENT_CLINIC_OR_DEPARTMENT_OTHER): Payer: Self-pay | Admitting: Obstetrics & Gynecology

## 2020-05-14 VITALS — BP 129/80 | HR 88 | Ht 64.0 in | Wt 174.0 lb

## 2020-05-14 DIAGNOSIS — Z01419 Encounter for gynecological examination (general) (routine) without abnormal findings: Secondary | ICD-10-CM

## 2020-05-14 DIAGNOSIS — Z3042 Encounter for surveillance of injectable contraceptive: Secondary | ICD-10-CM

## 2020-05-14 DIAGNOSIS — Z124 Encounter for screening for malignant neoplasm of cervix: Secondary | ICD-10-CM | POA: Diagnosis not present

## 2020-05-14 DIAGNOSIS — D250 Submucous leiomyoma of uterus: Secondary | ICD-10-CM | POA: Diagnosis not present

## 2020-05-14 MED ORDER — MEDROXYPROGESTERONE ACETATE 150 MG/ML IM SUSP
150.0000 mg | Freq: Once | INTRAMUSCULAR | Status: AC
  Administered 2020-05-14: 150 mg via INTRAMUSCULAR

## 2020-05-14 MED ORDER — MEDROXYPROGESTERONE ACETATE 150 MG/ML IM SUSP: 150.0000 mg | INTRAMUSCULAR | 3 refills | Status: DC

## 2020-05-14 MED FILL — medroxyPROGESTERone ACETATE: 150 | 1 days supply | Qty: 1 | Fill #0

## 2020-05-14 NOTE — Addendum Note (Signed)
Addended by: Lorelle Gibbs L on: 05/14/2020 01:07 PM   Modules accepted: Orders

## 2020-05-14 NOTE — Patient Instructions (Signed)

## 2020-05-14 NOTE — Progress Notes (Signed)
Subjective:     Angela Shah is a 44 y.o. female here for a routine exam.  Current complaints: Pt reports that she last took a Depo shot in 06/2019. In Oct she started bleeding and has not stopped. She reports that it was heavy but, not it is light. She was prev diagnosed with uterine fibroids and had pain with her cycles and some heavy bleeding with was controlled with the Depo Provera. She would like to restart the Depo Provera today.     Gynecologic History No LMP recorded. Patient has had an injection. Contraception: tubal ligation Last Pap: 03/02/2017. Results were: normal Last mammogram: 10/10/2018. Results were: normal  Obstetric History OB History  Gravida Para Term Preterm AB Living  4 4 2 2   4   SAB IAB Ectopic Multiple Live Births               # Outcome Date GA Lbr Len/2nd Weight Sex Delivery Anes PTL Lv  4 Preterm           3 Preterm           2 Term           1 Term              The following portions of the patient's history were reviewed and updated as appropriate: allergies, current medications, past family history, past medical history, past social history, past surgical history and problem list.  Review of Systems Pertinent items are noted in HPI.    Objective:  BP 129/80    Pulse 88    Ht 5\' 4"  (1.626 m)    Wt 174 lb (78.9 kg)    BMI 29.87 kg/m  General Appearance:    Alert, cooperative, no distress, appears stated age  Head:    Normocephalic, without obvious abnormality, atraumatic  Eyes:    conjunctiva/corneas clear, EOM's intact, both eyes  Ears:    Normal external ear canals, both ears  Nose:   Nares normal, septum midline, mucosa normal, no drainage    or sinus tenderness  Throat:   Lips, mucosa, and tongue normal; teeth and gums normal  Neck:   Supple, symmetrical, trachea midline, no adenopathy;    thyroid:  no enlargement/tenderness/nodules  Back:     Symmetric, no curvature, ROM normal, no CVA tenderness  Lungs:     respirations unlabored   Chest Wall:    No tenderness or deformity   Heart:    Regular rate and rhythm  Breast Exam:    No tenderness, masses, or nipple abnormality  Abdomen:     Soft, non-tender, bowel sounds active all four quadrants,    no masses, no organomegaly  Genitalia:    Normal female without lesion, discharge or tenderness   Enlarged uterus 12 weeks sized. Mobile. No adnexal masses. No blood in the vault.   Extremities:   Extremities normal, atraumatic, no cyanosis or edema  Pulses:   2+ and symmetric all extremities  Skin:   Skin color, texture, turgor normal, no rashes or lesions     Assessment:    Healthy female exam.   AUB Uterine fibroids. Likely the cause of the bleeding and probably exacerbated by the cessation of Depo Provera.    Plan:     Diagnoses and all orders for this visit:  Well female exam with routine gynecological exam -     Cytology - PAP( [facility])  Encounter for surveillance of injectable contraceptive -  medroxyPROGESTERone (DEPO-PROVERA) 150 MG/ML injection; Inject 1 mL (150 mg total) into the muscle every 3 (three) months.  Fibroids, submucosal  f/u in 3 months or sooner prn  Paschal Blanton L. Harraway-Smith, M.D., Evern Core

## 2020-05-14 NOTE — Progress Notes (Signed)
Patient states she has been bleeding for two months straight. Patient stopped using Depo PRovera (last injection 07-10-19)

## 2020-05-18 LAB — CYTOLOGY - PAP
Chlamydia: NEGATIVE
Comment: NEGATIVE
Comment: NEGATIVE
Comment: NORMAL
Diagnosis: NEGATIVE
High risk HPV: NEGATIVE
Neisseria Gonorrhea: NEGATIVE

## 2020-06-01 ENCOUNTER — Ambulatory Visit: Payer: Medicaid Other

## 2020-07-30 ENCOUNTER — Ambulatory Visit: Payer: Medicaid Other

## 2020-08-03 ENCOUNTER — Ambulatory Visit (INDEPENDENT_AMBULATORY_CARE_PROVIDER_SITE_OTHER): Payer: 59

## 2020-08-03 DIAGNOSIS — Z3042 Encounter for surveillance of injectable contraceptive: Secondary | ICD-10-CM

## 2020-08-03 MED ORDER — MEDROXYPROGESTERONE ACETATE 150 MG/ML IM SUSP
150.0000 mg | INTRAMUSCULAR | Status: DC
  Administered 2020-08-03 – 2021-01-30 (×3): 150 mg via INTRAMUSCULAR

## 2020-08-03 MED ORDER — MEDROXYPROGESTERONE ACETATE 150 MG/ML IM SUSP: 150.0000 mg | INTRAMUSCULAR | 3 refills | Status: DC

## 2020-08-03 MED FILL — medroxyPROGESTERone ACETATE: 150 | 84 days supply | Qty: 1 | Fill #1

## 2020-08-03 NOTE — Progress Notes (Addendum)
Patient presents for Depo Provera.  Patient states she is still having irregular bleeding and has fibroids. Patient scheduled to return for visit with provider to discuss plan of care for AUB with fibroids. Kathrene Alu RN   Attestation of Attending Supervision of RN: Evaluation and management procedures were performed by the nurse under my supervision and collaboration.  I have reviewed the nursing note and chart, and I agree with the management and plan.  Carolyn L. Harraway-Smith, M.D., Cherlynn June

## 2020-09-07 ENCOUNTER — Ambulatory Visit: Payer: PRIVATE HEALTH INSURANCE | Admitting: Obstetrics & Gynecology

## 2020-10-09 ENCOUNTER — Other Ambulatory Visit (HOSPITAL_BASED_OUTPATIENT_CLINIC_OR_DEPARTMENT_OTHER): Payer: Self-pay

## 2020-10-09 ENCOUNTER — Encounter: Payer: Self-pay | Admitting: Obstetrics & Gynecology

## 2020-10-09 ENCOUNTER — Other Ambulatory Visit: Payer: Self-pay

## 2020-10-09 ENCOUNTER — Ambulatory Visit (INDEPENDENT_AMBULATORY_CARE_PROVIDER_SITE_OTHER): Payer: Medicaid Other | Admitting: Obstetrics & Gynecology

## 2020-10-09 VITALS — BP 113/77 | HR 72 | Wt 175.0 lb

## 2020-10-09 DIAGNOSIS — N939 Abnormal uterine and vaginal bleeding, unspecified: Secondary | ICD-10-CM

## 2020-10-09 DIAGNOSIS — Z3042 Encounter for surveillance of injectable contraceptive: Secondary | ICD-10-CM

## 2020-10-09 DIAGNOSIS — D250 Submucous leiomyoma of uterus: Secondary | ICD-10-CM | POA: Diagnosis not present

## 2020-10-09 DIAGNOSIS — D251 Intramural leiomyoma of uterus: Secondary | ICD-10-CM

## 2020-10-09 MED ORDER — MEDROXYPROGESTERONE ACETATE 150 MG/ML IM SUSY
150.0000 mg | PREFILLED_SYRINGE | INTRAMUSCULAR | 3 refills | Status: DC
  Filled 2020-10-09: qty 1, 90d supply, fill #0
  Filled 2020-12-25 – 2020-12-29 (×3): qty 1, 90d supply, fill #1
  Filled 2021-03-19: qty 1, 90d supply, fill #2
  Filled 2021-06-04: qty 1, 90d supply, fill #3

## 2020-10-09 MED ORDER — TRANEXAMIC ACID 650 MG PO TABS
1300.0000 mg | ORAL_TABLET | Freq: Three times a day (TID) | ORAL | 2 refills | Status: DC
  Filled 2020-10-09: qty 30, 5d supply, fill #0

## 2020-10-09 NOTE — Progress Notes (Signed)
History:  45 y.o. [id number] here today for heavy bleeding. She is on Depo Provera and is due for her shot in 1 week. She normally does not bleed heavy on depo but, she passed clots bigger than a quarter. She reports no pain or other sx. She reports that she has no concern of STIs. She has a know h/o fibroids.  The bleeding has been for 4 days and cont to be heavy. No SOB or DOE.     The following portions of the patient's history were reviewed and updated as appropriate: allergies, current medications, past family history, past medical history, past social history, past surgical history and problem list.  Review of Systems:  Pertinent items are noted in HPI.    Objective:  Physical Exam Blood pressure 113/77, pulse 72, weight 175 lb (79.4 kg).  CONSTITUTIONAL: Well-developed, well-nourished female in no acute distress.  HENT:  Normocephalic, atraumatic EYES: Conjunctivae and EOM are normal. No scleral icterus.  NECK: Normal range of motion SKIN: Skin is warm and dry. No rash noted. Not diaphoretic.No pallor. Perry: Alert and oriented to person, place, and time. Normal coordination.  Pelvic: not done.   Labs and Imaging 08/20/2016 CLINICAL DATA:  Dysmenorrhea for 4 months  EXAM: TRANSABDOMINAL AND TRANSVAGINAL ULTRASOUND OF PELVIS  TECHNIQUE: Both transabdominal and transvaginal ultrasound examinations of the pelvis were performed. Transabdominal technique was performed for global imaging of the pelvis including uterus, ovaries, adnexal regions, and pelvic cul-de-sac. It was necessary to proceed with endovaginal exam following the transabdominal exam to visualize the endometrium, uterus and ovaries.  COMPARISON:  None  FINDINGS: Uterus  Measurements: 11.2 x 7.0 x 6.3 cm. Large leiomyoma at uterine fundus 5.4 x 3.8 x 4.5 cm, subserosal and intramural. Additional large leiomyoma at lower uterine segment on LEFT 5.0 x 4.6 x 4.0 cm.  Endometrium  Thickness: 12 mm  thick.  No endometrial fluid or focal abnormality  Right ovary  Measurements: 2.7 x 1.7 x 1.8 cm. Normal morphology without mass  Left ovary  Measurements: 2.6 x 2.1 x 1.5 cm. Normal morphology without mass  Other findings  No free pelvic fluid or adnexal masses.  Assessment of the adnexa limited by bowel gas.  IMPRESSION: Two large leiomyomata within the uterus as above.  Unremarkable ovaries.  Assessment & Plan:  Heavy menses in pt on Depo provera with known h/o uterine fibroids.  Diagnoses and all orders for this visit:  Abnormal uterine bleeding (AUB) -     tranexamic acid (LYSTEDA) 650 MG TABS tablet; Take 2 tablets (1,300 mg total) by mouth 3 (three) times daily. Take during menses for a maximum of five days -     US PELVIS TRANSVAGINAL NON-OB (TV ONLY); Future  Encounter for surveillance of injectable contraceptive -     medroxyPROGESTERone Acetate 150 MG/ML SUSY; Inject 1 mL (150 mg total) into the muscle every 3 (three) months.  Intramural and submucous leiomyoma of uterus -     US PELVIS TRANSVAGINAL NON-OB (TV ONLY); Future   f/u in 2-4 weeks or sooner prn  Savon Cobbs L. Harraway-Smith, M.D., Cherlynn June

## 2020-10-23 ENCOUNTER — Ambulatory Visit (HOSPITAL_BASED_OUTPATIENT_CLINIC_OR_DEPARTMENT_OTHER)
Admission: RE | Admit: 2020-10-23 | Discharge: 2020-10-23 | Disposition: A | Discharge status: Home / Self Care | Payer: Self-pay | Source: Ambulatory Visit | Attending: Obstetrics & Gynecology | Admitting: Obstetrics & Gynecology

## 2020-10-23 ENCOUNTER — Ambulatory Visit: Payer: 59

## 2020-10-23 ENCOUNTER — Other Ambulatory Visit: Payer: Self-pay

## 2020-10-23 DIAGNOSIS — D250 Submucous leiomyoma of uterus: Secondary | ICD-10-CM | POA: Insufficient documentation

## 2020-10-23 DIAGNOSIS — D251 Intramural leiomyoma of uterus: Secondary | ICD-10-CM | POA: Insufficient documentation

## 2020-10-23 DIAGNOSIS — N939 Abnormal uterine and vaginal bleeding, unspecified: Secondary | ICD-10-CM | POA: Insufficient documentation

## 2020-12-25 ENCOUNTER — Other Ambulatory Visit (HOSPITAL_BASED_OUTPATIENT_CLINIC_OR_DEPARTMENT_OTHER): Payer: Self-pay

## 2020-12-25 ENCOUNTER — Other Ambulatory Visit: Payer: Self-pay

## 2020-12-25 ENCOUNTER — Ambulatory Visit: Payer: Medicaid Other

## 2020-12-25 VITALS — BP 142/89 | HR 79

## 2020-12-28 NOTE — Progress Notes (Signed)
Patient couldn't get her Depo provera due to insurance saying it was too soon. Appointment cancelled and rescheduled for later date. Kathrene Alu RN

## 2020-12-29 ENCOUNTER — Other Ambulatory Visit (HOSPITAL_BASED_OUTPATIENT_CLINIC_OR_DEPARTMENT_OTHER): Payer: Self-pay

## 2021-01-01 ENCOUNTER — Other Ambulatory Visit: Payer: Self-pay

## 2021-01-01 ENCOUNTER — Ambulatory Visit (INDEPENDENT_AMBULATORY_CARE_PROVIDER_SITE_OTHER): Payer: 59

## 2021-01-01 VITALS — BP 137/89 | HR 83

## 2021-01-01 DIAGNOSIS — Z3042 Encounter for surveillance of injectable contraceptive: Secondary | ICD-10-CM

## 2021-01-01 NOTE — Progress Notes (Signed)
Patient presents for Depo Provera injection. Patient scheduled to come back in three months for next injection.  Patient provided medication. Depo Provera 150 mg given. Kathrene Alu RN

## 2021-01-01 NOTE — Progress Notes (Signed)
Chart reviewed - agree with CMA/RN documentation.  ° °

## 2021-02-10 ENCOUNTER — Other Ambulatory Visit: Payer: Self-pay

## 2021-02-10 MED ORDER — MEGESTROL ACETATE 40 MG PO TABS: ORAL_TABLET | ORAL | 5 refills | Status: AC

## 2021-02-10 NOTE — Telephone Encounter (Signed)
Patient called stating that she had episode of heavy bleeding today at work and bled through her pants. Patient states that the megace has helped her bleeding before. Refill sent. Patient given follow up appointment with Dr. Ihor Dow in three weeks. Kathrene Alu RN

## 2021-03-03 ENCOUNTER — Other Ambulatory Visit (HOSPITAL_COMMUNITY)
Admission: RE | Admit: 2021-03-03 | Discharge: 2021-03-03 | Disposition: A | Discharge status: Home / Self Care | Payer: 59 | Source: Ambulatory Visit | Attending: Obstetrics & Gynecology | Admitting: Obstetrics & Gynecology

## 2021-03-03 ENCOUNTER — Other Ambulatory Visit: Payer: Self-pay

## 2021-03-03 ENCOUNTER — Ambulatory Visit (INDEPENDENT_AMBULATORY_CARE_PROVIDER_SITE_OTHER): Payer: 59 | Admitting: Obstetrics & Gynecology

## 2021-03-03 ENCOUNTER — Encounter: Payer: Self-pay | Admitting: Obstetrics & Gynecology

## 2021-03-03 VITALS — BP 137/80 | HR 97 | Wt 179.0 lb

## 2021-03-03 DIAGNOSIS — N939 Abnormal uterine and vaginal bleeding, unspecified: Secondary | ICD-10-CM

## 2021-03-03 DIAGNOSIS — N921 Excessive and frequent menstruation with irregular cycle: Secondary | ICD-10-CM | POA: Diagnosis present

## 2021-03-03 DIAGNOSIS — D259 Leiomyoma of uterus, unspecified: Secondary | ICD-10-CM | POA: Diagnosis not present

## 2021-03-03 NOTE — Patient Instructions (Signed)
Uterine Artery Embolization for Fibroids Uterine artery embolization is a procedure to shrink uterine fibroids. Uterine fibroids are masses of tissue (tumors) that can develop in the womb (uterus). They are also called leiomyomas. This type of tumor is not cancerous (benign) and does not spread to other parts of the body outside of the pelvic area. The pelvic area is the part of the body between the hip bones. You can have one or many fibroids. Fibroids can vary in size, shape, weight, and where they grow in the uterus. Some can become quite large. In this procedure, a thin plastic tube (catheter) is used to inject a chemical that blocks off the blood supply to the fibroid, which causes the fibroid to shrink. Tell a health care provider about: Any allergies you have. All medicines you are taking, including vitamins, herbs, eye drops, creams, and over-the-counter medicines. Any problems you or family members have had with anesthetic medicines. Any blood disorders you have. Any surgeries you have had. Any medical conditions you have. Whether you are pregnant or may be pregnant. What are the risks? Generally, this is a safe procedure. However, problems may occur, including: Bleeding. Allergic reactions to medicines or dyes. Damage to other structures or organs. Infection, including blood infection (septicemia). Injury to the uterus from decreased blood supply. Lack of menstrual periods (amenorrhea). Death of tissue cells (necrosis) around your bladder or vulva. Development of a hole between organs or from an organ to the surface of your skin (fistula). Blood clot in the legs (deep vein thrombosis) or lung (pulmonary embolus). Nausea and vomiting. What happens before the procedure? Staying hydrated Follow instructions from your health care provider about hydration, which may include: Up to 2 hours before the procedure - you may continue to drink clear liquids, such as water, clear fruit juice,  black coffee, and plain tea. Eating and drinking restrictions Follow instructions from your health care provider about eating and drinking, which may include: 8 hours before the procedure - stop eating heavy meals or foods such as meat, fried foods, or fatty foods. 6 hours before the procedure - stop eating light meals or foods, such as toast or cereal. 6 hours before the procedure - stop drinking milk or drinks that contain milk. 2 hours before the procedure - stop drinking clear liquids. Medicines Ask your health care provider about: Changing or stopping your regular medicines. This is especially important if you are taking diabetes medicines or blood thinners. Taking over-the-counter medicines, vitamins, herbs, and supplements. Taking medicines such as aspirin and ibuprofen. These medicines can thin your blood. Do not take these medicines unless your health care provider tells you to take them. You may be given antibiotic medicine to help prevent infection. You may be given medicine to prevent nausea and vomiting (antiemetic). General instructions Ask your health care provider how your surgical site will be marked or identified. You may be asked to shower with a germ-killing soap. Plan to have someone take you home from the hospital or clinic. If you will be going home right after the procedure, plan to have someone with you for 24 hours. You will be asked to empty your bladder. What happens during the procedure? To lower your risk of infection: Your health care team will wash or sanitize their hands. Hair may be removed from the surgical area. Your skin will be washed with soap. An IV will be inserted into one of your veins. You will be given one or more of the following: A  medicine to help you relax (sedative). A medicine to numb the area (local anesthetic). A small cut (incision) will be made in your groin. A catheter will be inserted into the main artery of your leg. The catheter  will be guided through the artery to your uterus. A series of images will be taken while dye is injected through the catheter in your groin. X-rays are taken at the same time. This is done to provide a road map of the blood supply to your uterus and fibroids. Tiny plastic spheres, about the size of sand grains, will be injected through the catheter. Metal coils may be used to help block the artery. The particles will lodge in tiny branches of the uterine artery that supplies blood to the fibroids. The procedure will be repeated on the artery that supplies the other side of the uterus. The catheter will be removed and pressure will be applied to stop the bleeding. A dressing will be placed over the incision. The procedure may vary among health care providers and hospitals. What happens after the procedure? Your blood pressure, heart rate, breathing rate, and blood oxygen level will be monitored until the medicines you were given have worn off. You will be given pain medicine as needed. You may be given medicine for nausea and vomiting as needed. Do not drive for 24 hours if you were given a sedative. Summary Uterine artery embolization is a procedure to shrink uterine fibroids by blocking the blood supply to the fibroid. You may be given a sedative and local anesthetic for the procedure. A catheter will be inserted into the main artery of your leg. The catheter will be guided through the artery to your uterus. After the procedure you will be given pain medicine and medicine for nausea as needed. Do not drive for 24 hours if you were given a sedative. This information is not intended to replace advice given to you by your health care provider. Make sure you discuss any questions you have with your health care provider. Document Revised: 04/14/2017 Document Reviewed: 08/04/2016 Elsevier Patient Education  2021 [employer]. Uterine Artery Embolization for Fibroids, Care After This sheet gives you  information about how to care for yourself after your procedure. Your health care provider may also give you more specific instructions. If you have problems or questions, contact your health care provider. What can I expect after the procedure? After your procedure, it is common to have: Pelvic cramping. You will be given pain medicine. Nausea and vomiting. You may be given medicine to help relieve nausea. Follow these instructions at home: Incision care Follow instructions from your health care provider about how to take care of your incision. Make sure you: Wash your hands with soap and water before you change your bandage (dressing). If soap and water are not available, use hand sanitizer. Change your dressing as told by your health care provider. Check your incision area every day for signs of infection. Check for: More redness, swelling, or pain. More fluid or blood. Warmth. Pus or a bad smell. Medicines  Take over-the-counter and prescription medicines only as told by your health care provider. Do not take aspirin. It can cause bleeding. Do not drive for 24 hours if you were given a medicine to help you relax (sedative). Do not drive or use heavy machinery while taking prescription pain medicine. General instructions Ask your health care provider when you can resume sexual activity. To prevent or treat constipation while you are taking prescription  pain medicine, your health care provider may recommend that you: Drink enough fluid to keep your urine clear or pale yellow. Take over-the-counter or prescription medicines. Eat foods that are high in fiber, such as fresh fruits and vegetables, whole grains, and beans. Limit foods that are high in fat and processed sugars, such as fried and sweet foods. Contact a health care provider if: You have a fever. You have more redness, swelling, or pain around your incision site. You have more fluid or blood coming from your incision  site. Your incision feels warm to the touch. You have pus or a bad smell coming from your incision. You have a rash. You have uncontrolled nausea or you cannot eat or drink anything without vomiting. Get help right away if: You have trouble breathing. You have chest pain. You have severe abdominal pain. You have leg pain. You become dizzy and faint. Summary After your procedure, it is common to have pelvic cramping. You will be given pain medicine. Follow instructions from your health care provider about how to take care of your incision. Check your incision area every day for signs of infection. Take over-the-counter and prescription medicines only as told by your health care provider. This information is not intended to replace advice given to you by your health care provider. Make sure you discuss any questions you have with your health care provider. Document Revised: 04/14/2017 Document Reviewed: 08/04/2016 Elsevier Patient Education  2021 [employer].

## 2021-03-03 NOTE — Progress Notes (Signed)
Patient having bleeding with Depo Provera. Patient has heavy bleeding with Depo Provera - and she gets Depo Provera early. Patient states last heavy bleeding episodes she bleed through three pads. Kathrene Alu RN

## 2021-03-03 NOTE — Progress Notes (Signed)
History:  45 y.o. [id number] here today for AUB in spite of Depo Provera. She reports several days of very heavy brisk bleeding. She reports sporadic episode of heavy bleeding followed by spotting. There is no bleeding at present.  Pt is interested in long term management.      The following portions of the patient's history were reviewed and updated as appropriate: allergies, current medications, past family history, past medical history, past social history, past surgical history and problem list.  Review of Systems:  Pertinent items are noted in HPI.    Objective:  Physical Exam Blood pressure 137/80, pulse 97, weight 179 lb (81.2 kg).  CONSTITUTIONAL: Well-developed, well-nourished female in no acute distress.  HENT:  Normocephalic, atraumatic EYES: Conjunctivae and EOM are normal. No scleral icterus.  NECK: Normal range of motion SKIN: Skin is warm and dry. No rash noted. Not diaphoretic.No pallor. [place]: Alert and oriented to person, place, and time. Normal coordination.  Abd: Soft, nontender and nondistended Pelvic: Normal appearing external genitalia; normal appearing vaginal mucosa and cervix.  Normal discharge. Uterus 14 weeks; mobile; no other palpable masses, no uterine or adnexal tenderness  GYN procedure: The indications for endometrial biopsy were reviewed.   Risks of the biopsy including cramping, bleeding, infection, uterine perforation, inadequate specimen and need for additional procedures  were discussed. The patient states she understands and agrees to undergo procedure today. Consent was signed. Time out was performed. Urine HCG was negative. A sterile speculum was placed in the patient's vagina and the cervix was prepped with Betadine. A single-toothed tenaculum was placed on the anterior lip of the cervix to stabilize it. The 3 mm pipelle was introduced into the endometrial cavity without difficulty to a depth of 10cm, and a moderate amount of tissue was obtained and  sent to pathology. The instruments were removed from the patient's vagina. Minimal bleeding from the cervix was noted. The patient tolerated the procedure well. Routine post-procedure instructions were given to the patient.   Labs and Imaging 10/23/2020 CLINICAL DATA:  Abnormal uterine bleeding, fibroids, LMP 10/09/2020   EXAM: ULTRASOUND PELVIS TRANSVAGINAL   TECHNIQUE: Transvaginal ultrasound examination of the pelvis was performed including evaluation of the uterus, ovaries, adnexal regions, and pelvic cul-de-sac.   COMPARISON:  09/05/2017   FINDINGS: Uterus   Measurements: 10.1 x 6.5 x 6.7 cm = volume: 230 mL. Anteverted. Large mass at upper to mid uterus measuring 7.0 x 6.8 x 6.9 cm consistent with large leiomyoma, question transmural. Additional small question leiomyoma at lower uterine segment to LEFT, poorly visualized, question 2.2 cm diameter.   Endometrium   Thickness: Obscured due to presence of large leiomyoma at the upper to mid uterus.   Right ovary   Not visualized, likely obscured by bowel   Left ovary   Measurements: 3.7 x 2.2 x 2.6 cm = volume: 11 mL. Normal morphology without mass   Other findings:  No free pelvic fluid.  No adnexal masses.   IMPRESSION: Large leiomyoma at upper to mid uterus 7.0 cm greatest size, obscuring endometrial complex.   Suspect small additional leiomyoma at lower LEFT uterus 2.2 cm diameter.   Normal appearing LEFT ovary with nonvisualization of RIGHT ovary.    Assessment & Plan:  Diagnoses and all orders for this visit:  Breakthrough bleeding on depo provera -     Surgical pathology( [place]) -     Ambulatory referral to Interventional Radiology  Abnormal uterine bleeding -     Ambulatory referral to  Interventional Radiology  Uterine leiomyoma, unspecified location -     Ambulatory referral to Interventional Radiology -     Surgical pathology   Pt has been on Depo Provera, I believe her sx  are related to the fibroids. We have discussed options for management. Pt would like to avoid surgery. Discussed UFE and [place] management of fibroids with [place] as well as surgical options. Pt would like to proceed with UFE.       F/u surg path  Hoyle Sauer L. Harraway-Smith, M.D., Cherlynn June

## 2021-03-05 LAB — SURGICAL PATHOLOGY

## 2021-03-10 ENCOUNTER — Other Ambulatory Visit: Payer: Self-pay | Admitting: Obstetrics & Gynecology

## 2021-03-10 DIAGNOSIS — D259 Leiomyoma of uterus, unspecified: Secondary | ICD-10-CM

## 2021-03-19 ENCOUNTER — Other Ambulatory Visit: Payer: Self-pay

## 2021-03-19 ENCOUNTER — Ambulatory Visit (INDEPENDENT_AMBULATORY_CARE_PROVIDER_SITE_OTHER): Payer: 59

## 2021-03-19 ENCOUNTER — Other Ambulatory Visit (HOSPITAL_BASED_OUTPATIENT_CLINIC_OR_DEPARTMENT_OTHER): Payer: Self-pay

## 2021-03-19 DIAGNOSIS — N939 Abnormal uterine and vaginal bleeding, unspecified: Secondary | ICD-10-CM

## 2021-03-19 DIAGNOSIS — Z3042 Encounter for surveillance of injectable contraceptive: Secondary | ICD-10-CM

## 2021-03-19 MED ORDER — MEDROXYPROGESTERONE ACETATE 150 MG/ML IM SUSP
150.0000 mg | Freq: Once | INTRAMUSCULAR | Status: AC
  Administered 2021-03-19: 150 mg via INTRAMUSCULAR

## 2021-03-19 NOTE — Progress Notes (Addendum)
Patient presents for Depo Provera injection. Patient is having bleeding. Patient scheduled for appointment with IR for evaluation of her fibroids. Kathrene Alu RN  Attestation of Attending Supervision of RN: Evaluation and management procedures were performed by the nurse under my supervision and collaboration.  I have reviewed the nursing note and chart, and I agree with the management and plan.  Carolyn L. Harraway-Smith, M.D., Cherlynn June

## 2021-03-30 ENCOUNTER — Other Ambulatory Visit: Payer: 59

## 2021-04-06 ENCOUNTER — Ambulatory Visit
Admission: RE | Admit: 2021-04-06 | Discharge: 2021-04-06 | Disposition: A | Discharge status: Home / Self Care | Payer: 59 | Source: Ambulatory Visit | Attending: Obstetrics & Gynecology | Admitting: Obstetrics & Gynecology

## 2021-04-06 ENCOUNTER — Encounter: Payer: Self-pay | Admitting: *Deleted

## 2021-04-06 DIAGNOSIS — D259 Leiomyoma of uterus, unspecified: Secondary | ICD-10-CM

## 2021-04-06 HISTORY — PX: IR RADIOLOGIST EVAL & MGMT: IMG5224

## 2021-04-09 NOTE — Consult Note (Signed)
Chief Complaint: Patient was seen in consultation today for abnormal uterine bleeding  at the request of Harraway-Smith,Carolyn  Referring Physician(s): Harraway-Smith,Carolyn  History of Present Illness: Angela Shah is a 45 y.o. female with a history of uterine leiomyoma, with breakthrough bleeding on Depo-Provera.  Patient was initially diagnosed with uterine fibroids about 2 years ago, when she was having heavy bleeding, treated with megestrol.  Her periods now are irregular.  But she does have daily spotting lasting about 2 weeks.  And intermittent periods of heavy brisk bleeding.  She also complains of headache  and nausea.  Only   potential bulk symptoms constipation.  She is G4, P4 with no plans for future pregnancy.  Pap smear 05/14/2020 was negative.  Endometrial biopsy 03/05/2021 negative.  Ultrasound 10/15/2020 demonstrated 7 cm diameter fundal fibroid, possible lower left 2.2 cm fibroid, no adnexal mass.  She does have a history of left lower extremity DVT diagnosed Approximately 10 years ago.  More recent lower extremity ultrasound 07/29/2019 negative.  Past Medical History:  Diagnosis Date   Clotting disorder ([place])    DVT (deep venous thrombosis) ([place])     Past Surgical History:  Procedure Laterality Date   IR RADIOLOGIST EVAL & MGMT  04/06/2021   Ring tubal ligation      Allergies: Iodinated diagnostic agents and Rivaroxaban  Medications: Prior to Admission medications   Medication Sig Start Date End Date Taking? Authorizing Provider  acetaminophen (TYLENOL) 500 MG tablet Take 2 tablets (1,000 mg total) by mouth 2 (two) times daily. 07/11/18   Richardo Priest, MD  albuterol (VENTOLIN HFA) 108 (90 Base) MCG/ACT inhaler Inhale 2 puffs into the lungs every 6 (six) hours as needed for wheezing or shortness of breath. 05/29/19   Saguier, Percell Miller, PA-C  aspirin EC 81 MG tablet Take 1 tablet (81 mg total) by mouth daily. 07/11/18   Richardo Priest, MD  benzonatate  (TESSALON) 100 MG capsule Take 1 capsule (100 mg total) by mouth 3 (three) times daily as needed for cough. Patient not taking: Reported on 03/03/2021 05/29/19   Saguier, Percell Miller, PA-C  diclofenac (VOLTAREN) 75 MG EC tablet Take 75 mg by mouth 2 (two) times daily. Patient not taking: Reported on 03/03/2021 03/05/19   [provider]  fluticasone (FLONASE) 50 MCG/ACT nasal spray Place 2 sprays into both nostrils daily. 05/29/19   Saguier, Percell Miller, PA-C  hydrochlorothiazide (HYDRODIURIL) 12.5 MG tablet Take 1 daily as needed for leg swelling- use just when needed 10/31/17   Copland, Gay Filler, MD  medroxyPROGESTERone Acetate 150 MG/ML SUSY INJECT 1 ML (150 MG TOTAL) INTO THE MUSCLE EVERY 3 (THREE) MONTHS. 05/14/20 05/14/21  Lavonia Drafts, MD  medroxyPROGESTERone Acetate 150 MG/ML SUSY Inject 1 mL (150 mg total) into the muscle every 3 (three) months. 10/09/20   Lavonia Drafts, MD  megestrol (MEGACE) 40 MG tablet TAKE 2 TABLETS BY MOUTH TWICE DAILY IN THE EVENT OF HEAVY BLEEDING 02/10/21   Truett Mainland, DO  tranexamic acid (LYSTEDA) 650 MG TABS tablet Take 2 tablets (1,300 mg total) by mouth 3 (three) times daily. Take during menses for a maximum of five days Patient not taking: Reported on 03/03/2021 10/09/20   Lavonia Drafts, MD  She also describes contrast reaction, subsequently tolerated iodinated contrast after routine premedication protocol.  Family History  Problem Relation Age of Onset   Heart disease Mother    Cancer Sister        Ovarian cancer   Cancer Maternal Grandmother  Lung cancer    Social History   Socioeconomic History   Marital status: Single    Spouse name: Not on file   Number of children: Not on file   Years of education: Not on file   Highest education level: Not on file  Occupational History   Not on file  Tobacco Use   Smoking status: Never   Smokeless tobacco: Never  Vaping Use   Vaping Use: Never used  Substance and  Sexual Activity   Alcohol use: No   Drug use: No   Sexual activity: Yes    Birth control/protection: Surgical, Injection  Other Topics Concern   Not on file  Social History Narrative   Not on file   Social Determinants of Health   Financial Resource Strain: Not on file  Food Insecurity: Not on file  Transportation Needs: Not on file  Physical Activity: Not on file  Stress: Not on file  Social Connections: Not on file    ECOG Status: 1 - Symptomatic but completely ambulatory  Review of Systems: A 12 point ROS discussed and pertinent positives are indicated in the HPI above.  All other systems are negative.  Review of Systems  Vital Signs: BP (!) 148/89 (BP Location: Left Arm)   Pulse 78   SpO2 98%   Physical Exam Constitutional: Oriented to person, place, and time. Well-developed and well-nourished. No distress.   HENT:  Head: Normocephalic and atraumatic.  Eyes: Conjunctivae and EOM are normal. Right eye exhibits no discharge. Left eye exhibits no discharge. No scleral icterus.  Neck: No JVD present.  Pulmonary/Chest: Effort normal. No stridor. No respiratory distress.  Abdomen: soft, non distended Neurological:  alert and oriented to person, place, and time.  Skin: Skin is warm and dry.  not diaphoretic.  Psychiatric:   normal mood and affect.   behavior is normal. Judgment and thought content normal.   Review of Systems: A 12 point ROS discussed and pertinent positives are indicated in the HPI above.  All other systems are negative.  Review of Systems  Vital Signs: BP (!) 148/89 (BP Location: Left Arm)   Pulse 78   SpO2 98%     Imaging:  ULTRASOUND PELVIS TRANSVAGINAL  TECHNIQUE: Transvaginal ultrasound examination of the pelvis was performed including evaluation of the uterus, ovaries, adnexal regions, and pelvic cul-de-sac.  COMPARISON: 09/05/2017  FINDINGS: Uterus  Measurements: 10.1 x 6.5 x 6.7 cm = volume: 230 mL. Anteverted. Large mass at  upper to mid uterus measuring 7.0 x 6.8 x 6.9 cm consistent with large leiomyoma, question transmural. Additional small question leiomyoma at lower uterine segment to LEFT, poorly visualized, question 2.2 cm diameter.  Endometrium  Thickness: Obscured due to presence of large leiomyoma at the upper to mid uterus.  Right ovary  Not visualized, likely obscured by bowel  Left ovary  Measurements: 3.7 x 2.2 x 2.6 cm = volume: 11 mL. Normal morphology without mass  Other findings: No free pelvic fluid. No adnexal masses.  IMPRESSION: Large leiomyoma at upper to mid uterus 7.0 cm greatest size, obscuring endometrial complex.  Suspect small additional leiomyoma at lower LEFT uterus 2.2 cm diameter.  Normal appearing LEFT ovary with nonvisualization of RIGHT ovary.   Electronically Signed By: Lavonia Dana M.D. On: 10/23/2020 14:23   Labs:  CBC: No results for input(s): WBC, HGB, HCT, PLT in the last 8760 hours.  COAGS: No results for input(s): INR, APTT in the last 8760 hours.  BMP: No results for input(s):  NA, K, CL, CO2, GLUCOSE, BUN, CALCIUM, CREATININE, GFRNONAA, GFRAA in the last 8760 hours.  Invalid input(s): CMP  LIVER FUNCTION TESTS: No results for input(s): BILITOT, AST, ALT, ALKPHOS, PROT, ALBUMIN in the last 8760 hours.  TUMOR MARKERS: No results for input(s): AFPTM, CEA, CA199, CHROMGRNA in the last 8760 hours.  Assessment and Plan:  My impression is that this patient's menorrhagia is  likely secondary to uterine fibroids. We spent the majority of the consultation discussing the pathophysiology of uterine leiomyomata, natural history, anticipated  involution post menopause, and treatment options. We discussed myomectomy, hysterectomy, and uterine fibroid embolization. I described the technique of UFE, anticipated benefits, possible risks and complications including but not limited to bleeding, infection, vessel damage, nontarget embolization, and incomplete  symptom relief. We discussed the 90% clinical success rate historically and at our experience with UFE for bleeding. We discussed the post procedure course and time course of symptom resolution. We discussed the need for continued gynecologic care.  She seemed to understand and did ask appropriate questions, which were answered.  Based on her evaluation thus far, I think she would be an appropriate candidate for uterine fibroid embolization because of her symptomatology and  uterine fibroids. To complete her evaluation and workup, I would require pelvic MRI with contrast to best determine the exact site of location of her uterine fibroids, specifically to exclude any pedunculated fibroids on a narrow stalk, as well as to exclude any unexpected pelvic pathology.    After our discussion, the patient was motivated proceed. Accordingly, we can set up the MRI   at her convenience as an outpatient. If this looks okay, she would like to proceed with UFE.   Thank you for this interesting consult.  I greatly enjoyed meeting DAZIYAH COGAN and look forward to participating in their care.  A copy of this report was sent to the requesting provider on this date.  Electronically Signed: Rickard Rhymes 04/09/2021, 10:48 AM   I spent a total of  30 Minutes   in face to face in clinical consultation, greater than 50% of which was counseling/coordinating care for uterine fibroids and abnormal uterine bleeding.

## 2021-04-21 ENCOUNTER — Ambulatory Visit: Payer: 59 | Admitting: Obstetrics & Gynecology

## 2021-05-11 ENCOUNTER — Other Ambulatory Visit: Payer: Self-pay | Admitting: Interventional Radiology

## 2021-05-11 DIAGNOSIS — D259 Leiomyoma of uterus, unspecified: Secondary | ICD-10-CM

## 2021-05-22 ENCOUNTER — Ambulatory Visit (HOSPITAL_BASED_OUTPATIENT_CLINIC_OR_DEPARTMENT_OTHER)
Admission: RE | Admit: 2021-05-22 | Discharge: 2021-05-22 | Disposition: A | Discharge status: Home / Self Care | Payer: 59 | Source: Ambulatory Visit | Attending: Interventional Radiology | Admitting: Interventional Radiology

## 2021-05-22 ENCOUNTER — Other Ambulatory Visit: Payer: Self-pay

## 2021-05-22 DIAGNOSIS — D259 Leiomyoma of uterus, unspecified: Secondary | ICD-10-CM | POA: Insufficient documentation

## 2021-05-22 MED ORDER — GADOBUTROL 1 MMOL/ML IV SOLN
8.0000 mL | Freq: Once | INTRAVENOUS | Status: AC | PRN
  Administered 2021-05-22: 8 mL via INTRAVENOUS

## 2021-06-04 ENCOUNTER — Ambulatory Visit (INDEPENDENT_AMBULATORY_CARE_PROVIDER_SITE_OTHER): Payer: 59

## 2021-06-04 ENCOUNTER — Other Ambulatory Visit: Payer: Self-pay

## 2021-06-04 ENCOUNTER — Other Ambulatory Visit (HOSPITAL_BASED_OUTPATIENT_CLINIC_OR_DEPARTMENT_OTHER): Payer: Self-pay

## 2021-06-04 VITALS — BP 124/82 | HR 68 | Wt 174.0 lb

## 2021-06-04 DIAGNOSIS — Z3042 Encounter for surveillance of injectable contraceptive: Secondary | ICD-10-CM | POA: Diagnosis not present

## 2021-06-04 MED ORDER — MEDROXYPROGESTERONE ACETATE 150 MG/ML IM SUSP
150.0000 mg | Freq: Once | INTRAMUSCULAR | Status: AC
  Administered 2021-06-04: 150 mg via INTRAMUSCULAR

## 2021-06-04 NOTE — Progress Notes (Signed)
Chart reviewed - agree with CMA/RN documentation.  ° °

## 2021-06-04 NOTE — Progress Notes (Signed)
Angela Shah here for Depo-Provera  Injection.  Injection administered without complication. Patient will return in 3 months for next injection.  [name] l Breda Bond, CMA 06/04/2021  10:16 AM

## 2021-07-07 DIAGNOSIS — D259 Leiomyoma of uterus, unspecified: Secondary | ICD-10-CM | POA: Insufficient documentation

## 2021-07-08 ENCOUNTER — Other Ambulatory Visit: Payer: Self-pay | Admitting: Physician Assistant

## 2021-07-08 DIAGNOSIS — D252 Subserosal leiomyoma of uterus: Secondary | ICD-10-CM

## 2021-07-19 ENCOUNTER — Encounter: Payer: Self-pay | Admitting: *Deleted

## 2021-08-06 ENCOUNTER — Encounter: Payer: Self-pay | Admitting: *Deleted

## 2021-08-06 ENCOUNTER — Ambulatory Visit
Admission: RE | Admit: 2021-08-06 | Discharge: 2021-08-06 | Disposition: A | Discharge status: Home / Self Care | Payer: 59 | Source: Ambulatory Visit | Attending: Physician Assistant | Admitting: Physician Assistant

## 2021-08-06 ENCOUNTER — Other Ambulatory Visit: Payer: Self-pay

## 2021-08-06 DIAGNOSIS — D252 Subserosal leiomyoma of uterus: Secondary | ICD-10-CM

## 2021-08-06 HISTORY — PX: IR RADIOLOGIST EVAL & MGMT: IMG5224

## 2021-08-06 NOTE — Progress Notes (Signed)
Patient ID: Angela Shah, female   DOB: 1976-01-22, 46 y.o.   MRN: [id number] ?    ? ? ?Chief Complaint: ?Patient was consulted remotely today (TeleHealth) for follow-up uterine fibroid embolization at the request of Angela Shah.   ? ?Referring Physician(s): ?Angela Drafts, MD ? ?History of Present Illness: ?Angela Shah is a 46 y.o. female  with DVT during pregnancy and symptomatic uterine leiomyoma .  Angela Shah   had heavy menstrual bleeding with about 2 years which was treated with megestrol however she has recently had daily spotting and intermittent periods of brisk heavy bleeding. She has had 4 children and does not have plans for future pregnancies.  ?05/22/2021 MR demonstrates a large dominant fibroid replacing much of the normal uterus with diffuse homogenous enhancement, no evidence of cystic degeneration, encircling the endometrium with significant submucosal component.  There are additional smaller enhancing intramural and subserosal fibroids measuring up to 2.2 cm. ?07/07/2021 patient underwent uncomplicated transradial bilateral uterine artery   Embosphere embolization.  Her overnight observation was uneventful, requiring only a small amount of Dilaudid from her PCA.  She was able to ambulate, void, and tolerate diet the following morning and was discharged home in good condition. ? ?Patient response that she has done well post procedure.  Her uterine bleeding has improved.  Pelvic cramping has resolved.  She is back to usual activity. ? ? ?Past Medical History:  ?Diagnosis Date  ? Clotting disorder ([place])   ? DVT (deep venous thrombosis) ([place])   ? ? ?Past Surgical History:  ?Procedure Laterality Date  ? IR RADIOLOGIST EVAL & MGMT  04/06/2021  ? Ring tubal ligation    ? ? ?Allergies: ?Iodinated contrast media and Rivaroxaban ? ?Medications: ?Prior to Admission medications   ?Medication Sig Start Date End Date Taking? Authorizing Provider  ?acetaminophen (TYLENOL) 500 MG tablet  Take 2 tablets (1,000 mg total) by mouth 2 (two) times daily. 07/11/18   Richardo Priest, MD  ?albuterol (VENTOLIN HFA) 108 (90 Base) MCG/ACT inhaler Inhale 2 puffs into the lungs every 6 (six) hours as needed for wheezing or shortness of breath. 05/29/19   Saguier, Percell Miller, PA-C  ?aspirin EC 81 MG tablet Take 1 tablet (81 mg total) by mouth daily. 07/11/18   Richardo Priest, MD  ?benzonatate (TESSALON) 100 MG capsule Take 1 capsule (100 mg total) by mouth 3 (three) times daily as needed for cough. ?Patient not taking: Reported on 03/03/2021 05/29/19   Saguier, Percell Miller, PA-C  ?diclofenac (VOLTAREN) 75 MG EC tablet Take 75 mg by mouth 2 (two) times daily. ?Patient not taking: Reported on 03/03/2021 03/05/19   [provider]  ?fluticasone (FLONASE) 50 MCG/ACT nasal spray Place 2 sprays into both nostrils daily. ?Patient not taking: Reported on 06/04/2021 05/29/19   Saguier, Percell Miller, PA-C  ?hydrochlorothiazide (HYDRODIURIL) 12.5 MG tablet Take 1 daily as needed for leg swelling- use just when needed 10/31/17   Copland, Gay Filler, MD  ?megestrol (MEGACE) 40 MG tablet TAKE 2 TABLETS BY MOUTH TWICE DAILY IN THE EVENT OF HEAVY BLEEDING 02/10/21   Truett Mainland, DO  ?  ? ?Family History  ?Problem Relation Age of Onset  ? Heart disease Mother   ? Cancer Sister   ?     Ovarian cancer  ? Cancer Maternal Grandmother   ?     Lung cancer  ? ? ?Social History  ? ?Socioeconomic History  ? Marital status: Single  ?  Spouse name: Not on file  ?  Number of children: Not on file  ? Years of education: Not on file  ? Highest education level: Not on file  ?Occupational History  ? Not on file  ?Tobacco Use  ? Smoking status: Never  ? Smokeless tobacco: Never  ?Vaping Use  ? Vaping Use: Never used  ?Substance and Sexual Activity  ? Alcohol use: No  ? Drug use: No  ? Sexual activity: Yes  ?  Birth control/protection: Surgical, Injection  ?Other Topics Concern  ? Not on file  ?Social History Narrative  ? Not on file  ? ?Social Determinants  of Health  ? ?Financial Resource Strain: Not on file  ?Food Insecurity: Not on file  ?Transportation Needs: Not on file  ?Physical Activity: Not on file  ?Stress: Not on file  ?Social Connections: Not on file  ? ? ?ECOG Status: ?1 - Symptomatic but completely ambulatory ? ?Review of Systems ? ?Review of Systems: A 12 point ROS discussed and pertinent positives are indicated in the HPI above.  All other systems are negative. ? ?Physical Exam ?No direct physical exam was performed (except for noted visual exam findings with Video Visits).  ?   ?Vital Signs: ?There were no vitals taken for this visit. ? ?Imaging: ?No results found. ? ?Labs: ? ?CBC: ?No results for input(s): WBC, HGB, HCT, PLT in the last 8760 hours. ? ?COAGS: ?No results for input(s): INR, APTT in the last 8760 hours. ? ?BMP: ?No results for input(s): NA, K, CL, CO2, GLUCOSE, BUN, CALCIUM, CREATININE, GFRNONAA, GFRAA in the last 8760 hours. ? ?Invalid input(s): CMP ? ?LIVER FUNCTION TESTS: ?No results for input(s): BILITOT, AST, ALT, ALKPHOS, PROT, ALBUMIN in the last 8760 hours. ? ?TUMOR MARKERS: ?No results for input(s): AFPTM, CEA, CA199, CHROMGRNA in the last 8760 hours. ? ?Assessment and Plan: ? ?My impression is that this patient has done very well in the first month post uterine fibroid embolization.  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  She is back to full activity. ?We reviewed that it takes 3 to 6 months for the fibroids to involute to their final scar down size.  Her uterine bleeding should continue to improve over this time period and hopefully normalize.  I did caution her that should she pass any obvious tissue fragments, these would suggest fragmentation of the degenerated fibroid, she should let me know right away, as it could require D&C to avoid infection.  This is rare but important for her to understand.  Otherwise, I would expect her recovery to be uneventful.  She knows to call  with any questions or problems.  Otherwise, I will plan to follow-up with her at the 38-monthmark to make sure she has had adequate relief of symptoms. ? ?Thank you for this interesting consult.  I greatly enjoyed meeting BABBYGAIL WILLHOITEand look forward to participating in their care.  A copy of this report was sent to the requesting provider on this date. ? ?Electronically Signed: ?DRickard Rhymes?08/06/2021, 1:06 PM ? ? ?I spent a total of    15 Minutes in remote  clinical consultation, greater than 50% of which was counseling/coordinating care for uterine fibroids, post embolization.   ? ?Visit type: Audio only (telephone). Audio (no video) only due to patient's lack of internet/smartphone capability. ?[facility] [place] ?This visit type was conducted due to national recommendations for restrictions regarding the COVID-19 Pandemic (e.g.  social distancing).  This format is felt to be most appropriate for this patient at this time.  All issues noted in this document were discussed and addressed.   ? ?

## 2021-09-03 ENCOUNTER — Other Ambulatory Visit: Payer: Self-pay | Admitting: Family Medicine

## 2021-09-03 ENCOUNTER — Encounter: Payer: Self-pay | Admitting: Family Medicine

## 2021-09-03 ENCOUNTER — Ambulatory Visit (INDEPENDENT_AMBULATORY_CARE_PROVIDER_SITE_OTHER): Payer: 59 | Admitting: Family Medicine

## 2021-09-03 VITALS — BP 127/88 | HR 86 | Ht 64.0 in | Wt 174.0 lb

## 2021-09-03 DIAGNOSIS — Z1239 Encounter for other screening for malignant neoplasm of breast: Secondary | ICD-10-CM | POA: Diagnosis not present

## 2021-09-03 DIAGNOSIS — J0100 Acute maxillary sinusitis, unspecified: Secondary | ICD-10-CM | POA: Diagnosis not present

## 2021-09-03 DIAGNOSIS — Z3042 Encounter for surveillance of injectable contraceptive: Secondary | ICD-10-CM

## 2021-09-03 MED ORDER — FLUCONAZOLE 150 MG PO TABS: 150.0000 mg | ORAL_TABLET | Freq: Once | ORAL | 3 refills | Status: AC

## 2021-09-03 MED ORDER — MEDROXYPROGESTERONE ACETATE 150 MG/ML IM SUSP
150.0000 mg | Freq: Once | INTRAMUSCULAR | Status: AC
  Administered 2021-09-03: 150 mg via INTRAMUSCULAR

## 2021-09-03 MED ORDER — CEFDINIR 300 MG PO CAPS: 300.0000 mg | ORAL_CAPSULE | Freq: Two times a day (BID) | ORAL | 0 refills | Status: AC

## 2021-09-03 MED ORDER — FLUTICASONE PROPIONATE 50 MCG/ACT NA SUSP: 2.0000 | Freq: Every day | NASAL | 1 refills | Status: DC

## 2021-09-03 NOTE — Progress Notes (Signed)
? ?  Subjective:  ? ? Patient ID: Angela Shah, female    DOB: 1976/02/11, 46 y.o.   MRN: [id number] ? ?HPI ?Patient seen for contraception maintenance.  She is on Depo-Provera and doing well.  Additionally, she started having significant sinus pressure and pain with purulent rhinorrhea that started a week ago and has been progressing since that time.  With her symptoms, she will start coughing and eventually vomit.  Her pain is worse when laying back and improved when sitting up.  Denies fevers, chills.  She has attempted to use Mucinex DM, Sudafed, antihistamines, and over-the-counter mucolytic's without improvement. ? ? ?Review of Systems ? ?   ?Objective:  ? Physical Exam ?Vitals reviewed.  ?Constitutional:   ?   Appearance: Normal appearance.  ?HENT:  ?   Head: Normocephalic and atraumatic.  ?   Ears:  ?   Comments: Ears retracted bilaterally ?   Nose:  ?   Comments: Maxillary and frontal sinuses tender to percussion. ?Skin: ?   General: Skin is warm.  ?   Capillary Refill: Capillary refill takes less than 2 seconds.  ?Neurological:  ?   General: No focal deficit present.  ?   Mental Status: She is alert.  ? ? ?   ?Assessment & Plan:  ?1. Encounter for surveillance of injectable contraceptive ?Doing well.  Continue Depo-Provera injections ?- medroxyPROGESTERone (DEPO-PROVERA) injection 150 mg ? ?2. Breast cancer screening, high risk patient ?- MM 3D SCREEN BREAST BILATERAL; Future ? ?3. Acute non-recurrent maxillary sinusitis ?Cefdinir for antibiotics.  Continue with mucolytic's and antihistamines.  We will also add Flonase to help decrease sinus mucosal inflammation. ? ? ?

## 2021-09-03 NOTE — Progress Notes (Signed)
Date last pap: 05/14/2020 ?Last Depo-Pro/vera: 06/04/2021 ?Side Effects if any: None noted ?Serum HCG indicated? N/A ?Depo-Provera 150 mg IM given by: Mickey Farber, CMA ?Next appointment due: Jul 7th - Jul 21st ? ?Office stock provided today. ?

## 2021-11-05 ENCOUNTER — Ambulatory Visit (INDEPENDENT_AMBULATORY_CARE_PROVIDER_SITE_OTHER): Payer: 59 | Admitting: Family Medicine

## 2021-11-05 ENCOUNTER — Other Ambulatory Visit (HOSPITAL_COMMUNITY)
Admission: RE | Admit: 2021-11-05 | Discharge: 2021-11-05 | Disposition: A | Discharge status: Home / Self Care | Payer: 59 | Source: Ambulatory Visit | Attending: Family Medicine | Admitting: Family Medicine

## 2021-11-05 ENCOUNTER — Encounter: Payer: Self-pay | Admitting: Family Medicine

## 2021-11-05 VITALS — BP 129/88 | HR 75 | Ht 64.0 in | Wt 170.0 lb

## 2021-11-05 DIAGNOSIS — Z01419 Encounter for gynecological examination (general) (routine) without abnormal findings: Secondary | ICD-10-CM | POA: Insufficient documentation

## 2021-11-05 DIAGNOSIS — D252 Subserosal leiomyoma of uterus: Secondary | ICD-10-CM

## 2021-11-10 LAB — CYTOLOGY - PAP
Comment: NEGATIVE
Diagnosis: UNDETERMINED — AB
High risk HPV: NEGATIVE

## 2021-11-11 MED ORDER — METRONIDAZOLE 500 MG PO TABS: 500.0000 mg | ORAL_TABLET | Freq: Two times a day (BID) | ORAL | 0 refills | Status: DC

## 2021-11-11 NOTE — Addendum Note (Signed)
Addended by: Truett Mainland on: 11/11/2021 05:18 PM   Modules accepted: Orders

## 2021-11-26 ENCOUNTER — Other Ambulatory Visit: Payer: Self-pay

## 2021-11-26 ENCOUNTER — Ambulatory Visit (INDEPENDENT_AMBULATORY_CARE_PROVIDER_SITE_OTHER): Payer: 59

## 2021-11-26 ENCOUNTER — Other Ambulatory Visit (HOSPITAL_BASED_OUTPATIENT_CLINIC_OR_DEPARTMENT_OTHER): Payer: Self-pay

## 2021-11-26 VITALS — BP 132/95 | HR 73

## 2021-11-26 DIAGNOSIS — Z3042 Encounter for surveillance of injectable contraceptive: Secondary | ICD-10-CM | POA: Diagnosis not present

## 2021-11-26 MED ORDER — MEDROXYPROGESTERONE ACETATE 150 MG/ML IM SUSP
150.0000 mg | Freq: Once | INTRAMUSCULAR | Status: AC
  Administered 2022-02-18: 150 mg via INTRAMUSCULAR

## 2021-11-26 MED ORDER — MEDROXYPROGESTERONE ACETATE 150 MG/ML IM SUSP
150.0000 mg | Freq: Once | INTRAMUSCULAR | Status: AC
  Administered 2021-11-26: 150 mg via INTRAMUSCULAR

## 2021-11-26 MED ORDER — MEDROXYPROGESTERONE ACETATE 150 MG/ML IM SUSY
150.0000 mg | PREFILLED_SYRINGE | INTRAMUSCULAR | 3 refills | Status: DC
  Filled 2021-11-26: qty 1, 90d supply, fill #0
  Filled 2022-02-18: qty 1, 90d supply, fill #1
  Filled 2022-05-12: qty 1, 90d supply, fill #2
  Filled 2022-08-05: qty 1, 90d supply, fill #3

## 2021-11-26 NOTE — Progress Notes (Signed)
Date last pap: 11/06/2019 Last Depo-Provera: 09/03/2021 Side Effects if any: None Serum HCG indicated? NA Depo-Provera 150 mg IM given by: Mickey Farber, CMA Next appointment due: Sept 29th - Oct 13th, 2023

## 2021-12-23 ENCOUNTER — Telehealth (HOSPITAL_BASED_OUTPATIENT_CLINIC_OR_DEPARTMENT_OTHER): Payer: Self-pay

## 2021-12-29 ENCOUNTER — Other Ambulatory Visit: Payer: Self-pay | Admitting: Interventional Radiology

## 2021-12-29 DIAGNOSIS — D259 Leiomyoma of uterus, unspecified: Secondary | ICD-10-CM

## 2022-01-22 ENCOUNTER — Ambulatory Visit (HOSPITAL_BASED_OUTPATIENT_CLINIC_OR_DEPARTMENT_OTHER)
Admission: RE | Admit: 2022-01-22 | Discharge: 2022-01-22 | Disposition: A | Discharge status: Home / Self Care | Payer: 59 | Source: Ambulatory Visit | Attending: Interventional Radiology | Admitting: Interventional Radiology

## 2022-01-22 DIAGNOSIS — D259 Leiomyoma of uterus, unspecified: Secondary | ICD-10-CM

## 2022-01-31 ENCOUNTER — Ambulatory Visit
Admission: RE | Admit: 2022-01-31 | Discharge: 2022-01-31 | Disposition: A | Discharge status: Home / Self Care | Payer: 59 | Source: Ambulatory Visit | Attending: Interventional Radiology | Admitting: Interventional Radiology

## 2022-01-31 DIAGNOSIS — D259 Leiomyoma of uterus, unspecified: Secondary | ICD-10-CM

## 2022-02-14 ENCOUNTER — Telehealth: Payer: Self-pay | Admitting: Interventional Radiology

## 2022-02-14 NOTE — Telephone Encounter (Signed)
Called patient to reschedule follow up telephone call w/Dr Vernard Gambles. Patient unable to schedule at this time and will call back when she is ready to schedule/sb

## 2022-02-18 ENCOUNTER — Ambulatory Visit (INDEPENDENT_AMBULATORY_CARE_PROVIDER_SITE_OTHER): Payer: 59

## 2022-02-18 ENCOUNTER — Other Ambulatory Visit (HOSPITAL_BASED_OUTPATIENT_CLINIC_OR_DEPARTMENT_OTHER): Payer: Self-pay

## 2022-02-18 VITALS — BP 133/88 | HR 80

## 2022-02-18 DIAGNOSIS — Z3042 Encounter for surveillance of injectable contraceptive: Secondary | ICD-10-CM

## 2022-02-18 NOTE — Progress Notes (Signed)
Date last pap: 06/23/20223. Last Depo-Provera: 11/26/2021. Side Effects if any: none. Serum HCG indicated? NA. Depo-Provera 150 mg IM given by: Sharlene Dory RN. Next appointment due 3 months for next depo injection.   Kathrene Alu RN

## 2022-05-12 ENCOUNTER — Other Ambulatory Visit (HOSPITAL_BASED_OUTPATIENT_CLINIC_OR_DEPARTMENT_OTHER): Payer: Self-pay

## 2022-05-13 ENCOUNTER — Ambulatory Visit (INDEPENDENT_AMBULATORY_CARE_PROVIDER_SITE_OTHER): Payer: 59

## 2022-05-13 VITALS — BP 134/88 | HR 70 | Wt 167.0 lb

## 2022-05-13 DIAGNOSIS — Z3042 Encounter for surveillance of injectable contraceptive: Secondary | ICD-10-CM | POA: Diagnosis not present

## 2022-05-13 MED ORDER — MEDROXYPROGESTERONE ACETATE 150 MG/ML IM SUSP
150.0000 mg | Freq: Once | INTRAMUSCULAR | Status: AC
  Administered 2022-05-13: 150 mg via INTRAMUSCULAR

## 2022-05-13 NOTE — Progress Notes (Signed)
Angela Shah here for Depo-Provera  Injection.  Injection administered without complication. Patient will return in 3 months for next injection.  Angela Shah l Armen Waring, CMA 05/13/2022  8:23 AM

## 2022-08-05 ENCOUNTER — Other Ambulatory Visit (HOSPITAL_BASED_OUTPATIENT_CLINIC_OR_DEPARTMENT_OTHER): Payer: Self-pay

## 2022-08-05 ENCOUNTER — Ambulatory Visit (INDEPENDENT_AMBULATORY_CARE_PROVIDER_SITE_OTHER): Payer: 59

## 2022-08-05 VITALS — BP 129/79 | HR 84 | Wt 170.0 lb

## 2022-08-05 DIAGNOSIS — Z3042 Encounter for surveillance of injectable contraceptive: Secondary | ICD-10-CM | POA: Diagnosis not present

## 2022-08-05 MED ORDER — MEDROXYPROGESTERONE ACETATE 150 MG/ML IM SUSY
150.0000 mg | PREFILLED_SYRINGE | Freq: Once | INTRAMUSCULAR | Status: AC
  Administered 2022-08-05: 150 mg via INTRAMUSCULAR

## 2022-08-05 NOTE — Progress Notes (Signed)
Date last pap: 11-05-21. Last Depo-Provera: 05-13-22. Side Effects if any: none. Serum HCG indicated? none. Depo-Provera 150 mg IM given by: Sharlene Dory RN. Next appointment due scheduled for three months. Kathrene Alu RN

## 2022-10-28 ENCOUNTER — Ambulatory Visit: Payer: 59

## 2022-12-16 ENCOUNTER — Ambulatory Visit: Payer: 59

## 2022-12-16 ENCOUNTER — Other Ambulatory Visit: Payer: Self-pay | Admitting: Family Medicine

## 2022-12-16 ENCOUNTER — Other Ambulatory Visit (HOSPITAL_BASED_OUTPATIENT_CLINIC_OR_DEPARTMENT_OTHER): Payer: Self-pay

## 2022-12-16 VITALS — BP 134/89 | HR 67 | Wt 173.0 lb

## 2022-12-16 DIAGNOSIS — Z3042 Encounter for surveillance of injectable contraceptive: Secondary | ICD-10-CM | POA: Diagnosis not present

## 2022-12-16 MED ORDER — MEDROXYPROGESTERONE ACETATE 150 MG/ML IM SUSY
150.0000 mg | PREFILLED_SYRINGE | INTRAMUSCULAR | 3 refills | Status: DC
  Filled 2022-12-16: qty 1, 90d supply, fill #0
  Filled 2023-03-14 – 2023-03-15 (×2): qty 1, 90d supply, fill #1
  Filled 2023-05-24: qty 1, 90d supply, fill #2
  Filled 2023-08-17: qty 1, 90d supply, fill #3

## 2022-12-16 MED ORDER — MEDROXYPROGESTERONE ACETATE 150 MG/ML IM SUSY
150.0000 mg | PREFILLED_SYRINGE | Freq: Once | INTRAMUSCULAR | Status: AC
  Administered 2022-12-16: 150 mg via INTRAMUSCULAR

## 2022-12-16 NOTE — Progress Notes (Signed)
Angela Shah here for Depo-Provera  Injection.  Injection administered without complication. Patient will return in 3 months for next injection.   Keyarah Mcroy l Johntae Broxterman, CMA 12/16/2022  9:16 AM

## 2023-01-30 NOTE — Progress Notes (Unsigned)
New patient visit   Patient: Angela Shah   DOB: 1975/12/06   47 y.o. Female  MRN: [id number] Visit Date: 01/31/2023  Today's healthcare provider: Alfredia Ferguson, PA-C   No chief complaint on file.  Subjective    Angela Shah is a 47 y.o. female who presents today as a new patient to establish care.  HPI  ***  Past Medical History:  Diagnosis Date   Clotting disorder ([facility])    DVT (deep venous thrombosis) ([facility])    Past Surgical History:  Procedure Laterality Date   IR RADIOLOGIST EVAL & MGMT  04/06/2021   IR RADIOLOGIST EVAL & MGMT  08/06/2021   Ring tubal ligation     Family Status  Relation Name Status   Mother  Alive   Sister  (Not Specified)   MGM  (Not Specified)  No partnership data on file   Family History  Problem Relation Age of Onset   Heart disease Mother    Cancer Sister        Ovarian cancer   Cancer Maternal Grandmother        Lung cancer   Social History   Socioeconomic History   Marital status: Single    Spouse name: Not on file   Number of children: Not on file   Years of education: Not on file   Highest education level: Not on file  Occupational History   Not on file  Tobacco Use   Smoking status: Never   Smokeless tobacco: Never  Vaping Use   Vaping status: Never Used  Substance and Sexual Activity   Alcohol use: No   Drug use: No   Sexual activity: Yes    Birth control/protection: Surgical, Injection  Other Topics Concern   Not on file  Social History Narrative   Not on file   Social Determinants of Health   Financial Resource Strain: Not on file  Food Insecurity: Not on file  Transportation Needs: Not on file  Physical Activity: Not on file  Stress: Not on file  Social Connections: Not on file   Outpatient Medications Prior to Visit  Medication Sig   acetaminophen (TYLENOL) 500 MG tablet Take 2 tablets (1,000 mg total) by mouth 2 (two) times daily.   albuterol (VENTOLIN HFA) 108 (90 Base) MCG/ACT inhaler  Inhale 2 puffs into the lungs every 6 (six) hours as needed for wheezing or shortness of breath.   aspirin EC 81 MG tablet Take 1 tablet (81 mg total) by mouth daily.   benzonatate (TESSALON) 100 MG capsule Take 1 capsule (100 mg total) by mouth 3 (three) times daily as needed for cough. (Patient not taking: Reported on 05/13/2022)   diclofenac (VOLTAREN) 75 MG EC tablet Take 75 mg by mouth 2 (two) times daily. (Patient not taking: Reported on 05/13/2022)   fluticasone (FLONASE) 50 MCG/ACT nasal spray SHAKE LIQUID AND USE 2 SPRAYS IN EACH NOSTRIL DAILY (Patient not taking: Reported on 05/13/2022)   hydrochlorothiazide (HYDRODIURIL) 12.5 MG tablet Take 1 daily as needed for leg swelling- use just when needed (Patient not taking: Reported on 12/16/2022)   medroxyPROGESTERone Acetate 150 MG/ML SUSY Inject 1 mL (150 mg total) into the muscle every 3 (three) months.   megestrol (MEGACE) 40 MG tablet TAKE 2 TABLETS BY MOUTH TWICE DAILY IN THE EVENT OF HEAVY BLEEDING   metroNIDAZOLE (FLAGYL) 500 MG tablet Take 1 tablet (500 mg total) by mouth 2 (two) times daily. (Patient not taking: Reported on 05/13/2022)  No facility-administered medications prior to visit.   Allergies  Allergen Reactions   Iodinated Contrast Media Hives   Rivaroxaban Hives    Also causes headache    Immunization History  Administered Date(s) Administered   PFIZER(Purple Top)SARS-COV-2 Vaccination 08/26/2019, 10/10/2019   Tdap 08/15/2016    Health Maintenance  Topic Date Due   HIV Screening  Never done   Hepatitis C Screening  Never done   Colonoscopy  Never done   INFLUENZA VACCINE  Never done   COVID-19 Vaccine (3 - 2023-24 season) 01/15/2023   PAP SMEAR-Modifier  11/05/2024   DTaP/Tdap/Td (2 - Td or Tdap) 08/16/2026   HPV VACCINES  Aged Out    Patient Care Team: Earleen Newport, MD as Consulting Physician (Obstetrics and Gynecology)  Review of Systems    Objective    There were no vitals taken for this  visit.  Physical Exam ***  Depression Screen    05/29/2019    3:21 PM  PHQ 2/9 Scores  PHQ - 2 Score 0  PHQ- 9 Score 0   No results found for any visits on 01/31/23.  Assessment & Plan     ***  No follow-ups on file.     {provider attestation***:1}   Alfredia Ferguson, PA-C  [facility] at [facility] [phone] (phone) [phone] (fax)  [facility]

## 2023-01-31 ENCOUNTER — Ambulatory Visit (INDEPENDENT_AMBULATORY_CARE_PROVIDER_SITE_OTHER): Payer: 59 | Admitting: Physician Assistant

## 2023-01-31 ENCOUNTER — Encounter: Payer: Self-pay | Admitting: Physician Assistant

## 2023-01-31 ENCOUNTER — Ambulatory Visit (HOSPITAL_BASED_OUTPATIENT_CLINIC_OR_DEPARTMENT_OTHER)
Admission: RE | Admit: 2023-01-31 | Discharge: 2023-01-31 | Disposition: A | Discharge status: Home / Self Care | Payer: 59 | Source: Ambulatory Visit | Attending: Physician Assistant | Admitting: Physician Assistant

## 2023-01-31 VITALS — BP 124/82 | HR 68 | Temp 98.8°F | Resp 20 | Ht 64.0 in | Wt 174.0 lb

## 2023-01-31 DIAGNOSIS — Z1211 Encounter for screening for malignant neoplasm of colon: Secondary | ICD-10-CM

## 2023-01-31 DIAGNOSIS — Z86718 Personal history of other venous thrombosis and embolism: Secondary | ICD-10-CM

## 2023-01-31 DIAGNOSIS — Z1231 Encounter for screening mammogram for malignant neoplasm of breast: Secondary | ICD-10-CM

## 2023-01-31 DIAGNOSIS — R590 Localized enlarged lymph nodes: Secondary | ICD-10-CM | POA: Diagnosis not present

## 2023-01-31 DIAGNOSIS — R600 Localized edema: Secondary | ICD-10-CM | POA: Insufficient documentation

## 2023-01-31 MED ORDER — HYDROCHLOROTHIAZIDE 12.5 MG PO TABS: ORAL_TABLET | ORAL | 1 refills | Status: AC

## 2023-01-31 NOTE — Assessment & Plan Note (Addendum)
  Pt was recommended by hematology to continue 2 baby ASA daily d/t strong fhx of thrombus and personal history of DVT

## 2023-01-31 NOTE — Assessment & Plan Note (Signed)
S/p DVT, -Resume hydrochlorothiazide 12.5 mg prn swelling -Refer to vascular for evaluation and possible intervention.

## 2023-03-10 ENCOUNTER — Ambulatory Visit: Payer: 59

## 2023-03-15 ENCOUNTER — Other Ambulatory Visit (HOSPITAL_BASED_OUTPATIENT_CLINIC_OR_DEPARTMENT_OTHER): Payer: Self-pay

## 2023-03-15 ENCOUNTER — Encounter (HOSPITAL_BASED_OUTPATIENT_CLINIC_OR_DEPARTMENT_OTHER): Payer: Self-pay

## 2023-03-15 ENCOUNTER — Ambulatory Visit: Payer: 59 | Admitting: Family Medicine

## 2023-03-15 ENCOUNTER — Ambulatory Visit (HOSPITAL_BASED_OUTPATIENT_CLINIC_OR_DEPARTMENT_OTHER)
Admission: RE | Admit: 2023-03-15 | Discharge: 2023-03-15 | Disposition: A | Discharge status: Home / Self Care | Payer: 59 | Source: Ambulatory Visit | Attending: Physician Assistant | Admitting: Physician Assistant

## 2023-03-15 ENCOUNTER — Other Ambulatory Visit: Payer: Self-pay

## 2023-03-15 ENCOUNTER — Encounter: Payer: Self-pay | Admitting: Family Medicine

## 2023-03-15 VITALS — BP 116/80 | HR 87 | Resp 16 | Ht 64.0 in | Wt 172.0 lb

## 2023-03-15 DIAGNOSIS — Z1339 Encounter for screening examination for other mental health and behavioral disorders: Secondary | ICD-10-CM | POA: Diagnosis not present

## 2023-03-15 DIAGNOSIS — D252 Subserosal leiomyoma of uterus: Secondary | ICD-10-CM

## 2023-03-15 DIAGNOSIS — Z01419 Encounter for gynecological examination (general) (routine) without abnormal findings: Secondary | ICD-10-CM | POA: Diagnosis not present

## 2023-03-15 DIAGNOSIS — Z1231 Encounter for screening mammogram for malignant neoplasm of breast: Secondary | ICD-10-CM | POA: Insufficient documentation

## 2023-03-15 DIAGNOSIS — Z3042 Encounter for surveillance of injectable contraceptive: Secondary | ICD-10-CM | POA: Diagnosis not present

## 2023-03-15 DIAGNOSIS — N939 Abnormal uterine and vaginal bleeding, unspecified: Secondary | ICD-10-CM | POA: Diagnosis not present

## 2023-03-15 MED ORDER — MEDROXYPROGESTERONE ACETATE 150 MG/ML IM SUSP
150.0000 mg | Freq: Once | INTRAMUSCULAR | Status: AC
Start: 2023-03-15 — End: 2023-03-15
  Administered 2023-03-15: 150 mg via INTRAMUSCULAR

## 2023-03-16 ENCOUNTER — Other Ambulatory Visit (HOSPITAL_BASED_OUTPATIENT_CLINIC_OR_DEPARTMENT_OTHER): Payer: Self-pay

## 2023-03-16 ENCOUNTER — Other Ambulatory Visit: Payer: Self-pay

## 2023-03-17 NOTE — Progress Notes (Signed)
ANNUAL EXAM Patient name: Angela Shah MRN [id number]  Date of birth: Jun 22, 1975 Chief Complaint:   Annual Exam  History of Present Illness:   Angela Shah is a 47 y.o.  [phone]  female  being seen today for a routine annual exam.  Current complaints: Having a week of bleeding before next depo, as well as bleeding a week following it. Cramping with the bleeding. Takes megace, which helps some.  No LMP recorded. Patient has had an injection.    Last pap 10/2021. Results were: ASCUS w/ HRHPV negative. H/O abnormal pap: no Last mammogram: today Last colonoscopy: needs to schedule.     03/15/2023   10:07 AM 01/31/2023    8:54 AM 05/29/2019    3:21 PM  Depression screen PHQ 2/9  Decreased Interest 0 0 0  Down, Depressed, Hopeless 0 0 0  PHQ - 2 Score 0 0 0  Altered sleeping 0  0  Tired, decreased energy 1  0  Change in appetite 0  0  Feeling bad or failure about yourself  0  0  Trouble concentrating 0  0  Moving slowly or fidgety/restless 0  0  Suicidal thoughts 0  0  PHQ-9 Score 1  0  Difficult doing work/chores   Not difficult at all        03/15/2023   10:07 AM  GAD 7 : Generalized Anxiety Score  Nervous, Anxious, on Edge 0  Control/stop worrying 0  Worry too much - different things 0  Trouble relaxing 0  Restless 0  Easily annoyed or irritable 1  Afraid - awful might happen 0  Total GAD 7 Score 1     Review of Systems:   Pertinent items are noted in HPI Denies any headaches, blurred vision, fatigue, shortness of breath, chest pain, abdominal pain, abnormal vaginal discharge/itching/odor/irritation, problems with periods, bowel movements, urination, or intercourse unless otherwise stated above. Pertinent History Reviewed:  Reviewed past medical,surgical, social and family history.  Reviewed problem list, medications and allergies. Physical Assessment:   Vitals:   03/15/23 0947  BP: 116/80  Pulse: 87  Resp: 16  Weight: 172 lb (78 kg)  Height: 5'  4" (1.626 m)  Body mass index is 29.52 kg/m.        Physical Examination:   General appearance - well appearing, and in no distress  Mental status - alert, oriented to person, place, and time  Psych:  She has a normal mood and affect  Skin - warm and dry, normal color, no suspicious lesions noted  Chest - effort normal, all lung fields clear to auscultation bilaterally  Heart - normal rate and regular rhythm  Neck:  midline trachea, no thyromegaly or nodules  Breasts - breasts appear normal, no suspicious masses, no skin or nipple changes or axillary nodes  Abdomen - soft, nontender, nondistended, no masses or organomegaly  Pelvic - VULVA: normal appearing vulva with no masses, tenderness or lesions  VAGINA: normal appearing vagina with normal color and discharge, no lesions  CERVIX: normal appearing cervix without discharge or lesions, no CMT  Thin prep pap is not done   UTERUS: uterus is felt to be normal size, shape, consistency and nontender   ADNEXA: No adnexal masses or tenderness noted.  Extremities:  No swelling or varicosities noted  Chaperone present for exam  Assessment & Plan:  1. Well woman exam with routine gynecological exam PAP current  2. Fibroids, subserous Check [place] - US PELVIC COMPLETE WITH TRANSVAGINAL;  Future  3. Abnormal uterine bleeding Will move timing of depo to a week before the normal window. Hopefully this will decrease the amount of bleeding she has. - US PELVIC COMPLETE WITH TRANSVAGINAL; Future    Labs/procedures today:   Orders Placed This Encounter  Procedures   US PELVIC COMPLETE WITH TRANSVAGINAL    Meds:  Meds ordered this encounter  Medications   medroxyPROGESTERone (DEPO-PROVERA) injection 150 mg    Follow-up: No follow-ups on file.  Levie Heritage, DO 03/17/2023 1:40 PM

## 2023-03-23 ENCOUNTER — Ambulatory Visit (HOSPITAL_BASED_OUTPATIENT_CLINIC_OR_DEPARTMENT_OTHER)
Admission: RE | Admit: 2023-03-23 | Discharge: 2023-03-23 | Disposition: A | Discharge status: Home / Self Care | Payer: 59 | Source: Ambulatory Visit | Attending: Family Medicine | Admitting: Family Medicine

## 2023-03-23 DIAGNOSIS — N939 Abnormal uterine and vaginal bleeding, unspecified: Secondary | ICD-10-CM | POA: Insufficient documentation

## 2023-03-23 DIAGNOSIS — D252 Subserosal leiomyoma of uterus: Secondary | ICD-10-CM | POA: Insufficient documentation

## 2023-04-04 ENCOUNTER — Encounter: Payer: Self-pay | Admitting: Family Medicine

## 2023-04-04 DIAGNOSIS — N8003 Adenomyosis of the uterus: Secondary | ICD-10-CM | POA: Insufficient documentation

## 2023-05-25 ENCOUNTER — Ambulatory Visit (INDEPENDENT_AMBULATORY_CARE_PROVIDER_SITE_OTHER): Payer: 59

## 2023-05-25 VITALS — BP 114/85 | HR 77 | Wt 177.0 lb

## 2023-05-25 DIAGNOSIS — Z3042 Encounter for surveillance of injectable contraceptive: Secondary | ICD-10-CM

## 2023-05-25 MED ORDER — MEDROXYPROGESTERONE ACETATE 150 MG/ML IM SUSP
150.0000 mg | Freq: Once | INTRAMUSCULAR | Status: AC
  Administered 2023-05-25: 150 mg via INTRAMUSCULAR

## 2023-05-25 NOTE — Progress Notes (Signed)
 Angela Shah here for Depo-Provera  Injection.  Injection administered without complication. Patient will return in 3 months for next injection.  Temisha Murley l Havana Baldwin, CMA 05/25/2023  1:52 PM

## 2023-08-18 ENCOUNTER — Other Ambulatory Visit (HOSPITAL_BASED_OUTPATIENT_CLINIC_OR_DEPARTMENT_OTHER): Payer: Self-pay

## 2023-08-18 ENCOUNTER — Ambulatory Visit: Payer: 59

## 2023-08-18 VITALS — BP 141/81 | HR 81 | Ht 64.0 in | Wt 174.0 lb

## 2023-08-18 DIAGNOSIS — D252 Subserosal leiomyoma of uterus: Secondary | ICD-10-CM | POA: Diagnosis not present

## 2023-08-18 DIAGNOSIS — Z3042 Encounter for surveillance of injectable contraceptive: Secondary | ICD-10-CM | POA: Diagnosis not present

## 2023-08-18 MED ORDER — MEDROXYPROGESTERONE ACETATE 150 MG/ML IM SUSY
150.0000 mg | PREFILLED_SYRINGE | Freq: Once | INTRAMUSCULAR | Status: AC
  Administered 2023-08-18: 150 mg via INTRAMUSCULAR

## 2023-08-18 NOTE — Progress Notes (Signed)
 Date last pap: 11/05/2021. Last Depo-Provera: 05/25/2023. Side Effects if any: none. Serum HCG indicated? NA. Depo-Provera 150 mg IM given by: Danna Hefty . Next appointment due 12 weeks.

## 2023-09-06 ENCOUNTER — Encounter: Payer: Self-pay | Admitting: Physician Assistant

## 2023-09-06 ENCOUNTER — Ambulatory Visit (INDEPENDENT_AMBULATORY_CARE_PROVIDER_SITE_OTHER): Admitting: Physician Assistant

## 2023-09-06 VITALS — BP 120/85 | HR 67 | Ht 64.0 in | Wt 174.4 lb

## 2023-09-06 DIAGNOSIS — R519 Headache, unspecified: Secondary | ICD-10-CM | POA: Diagnosis not present

## 2023-09-06 LAB — CBC WITH DIFFERENTIAL/PLATELET
Basophils Absolute: 0 10*3/uL (ref 0.0–0.1)
Basophils Relative: 0.4 % (ref 0.0–3.0)
Eosinophils Absolute: 0.1 10*3/uL (ref 0.0–0.7)
Eosinophils Relative: 1.4 % (ref 0.0–5.0)
HCT: 43.4 % (ref 36.0–46.0)
Hemoglobin: 14 g/dL (ref 12.0–15.0)
Lymphocytes Relative: 50.6 % — ABNORMAL HIGH (ref 12.0–46.0)
Lymphs Abs: 3.4 10*3/uL (ref 0.7–4.0)
MCHC: 32.3 g/dL (ref 30.0–36.0)
MCV: 75.9 fl — ABNORMAL LOW (ref 78.0–100.0)
Monocytes Absolute: 0.5 10*3/uL (ref 0.1–1.0)
Monocytes Relative: 7.7 % (ref 3.0–12.0)
Neutro Abs: 2.7 10*3/uL (ref 1.4–7.7)
Neutrophils Relative %: 39.9 % — ABNORMAL LOW (ref 43.0–77.0)
Platelets: 293 10*3/uL (ref 150.0–400.0)
RBC: 5.72 Mil/uL — ABNORMAL HIGH (ref 3.87–5.11)
RDW: 14.3 % (ref 11.5–15.5)
WBC: 6.7 10*3/uL (ref 4.0–10.5)

## 2023-09-06 LAB — COMPREHENSIVE METABOLIC PANEL WITH GFR
ALT: 14 U/L (ref 0–35)
AST: 14 U/L (ref 0–37)
Albumin: 4 g/dL (ref 3.5–5.2)
Alkaline Phosphatase: 77 U/L (ref 39–117)
BUN: 10 mg/dL (ref 6–23)
CO2: 27 meq/L (ref 19–32)
Calcium: 9.1 mg/dL (ref 8.4–10.5)
Chloride: 104 meq/L (ref 96–112)
Creatinine, Ser: 0.87 mg/dL (ref 0.40–1.20)
GFR: 79.99 mL/min (ref >60.00)
Glucose, Bld: 79 mg/dL (ref 70–99)
Potassium: 4.3 meq/L (ref 3.5–5.1)
Sodium: 137 meq/L (ref 135–145)
Total Bilirubin: 0.3 mg/dL (ref 0.2–1.2)
Total Protein: 6.9 g/dL (ref 6.0–8.3)

## 2023-09-06 LAB — HEMOGLOBIN A1C: Hgb A1c MFr Bld: 5.7 % (ref 4.6–6.5)

## 2023-09-06 LAB — TSH: TSH: 1.4 u[IU]/mL (ref 0.35–5.50)

## 2023-09-06 LAB — VITAMIN B12: Vitamin B-12: 417 pg/mL (ref 211–911)

## 2023-09-06 MED ORDER — KETOROLAC TROMETHAMINE 60 MG/2ML IM SOLN
60.0000 mg | Freq: Once | INTRAMUSCULAR | Status: AC
  Administered 2023-09-06: 60 mg via INTRAMUSCULAR

## 2023-09-06 NOTE — Progress Notes (Signed)
 Established patient visit   Patient: Angela Shah   DOB: Jan 21, 1976   48 y.o. Female  MRN: [id number] Visit Date: 09/06/2023  Today's healthcare provider: Trenton Frock, PA-C   Cc. headache  Subjective     Discussed the use of AI scribe software for clinical note transcription with the patient, who gave verbal consent to proceed.  History of Present Illness   The patient presents with a persistent headache that started about a week ago following a stressful event involving her daughter's health. The headache has been consistent and was severe enough to cause blurry vision and dizziness at one point. The patient reports that the blurry vision resolved after closing her eyes and sitting still. She also struggles with vertigo. The headache is described as an ache affecting the whole front of the head, with a constant presence on one side. The patient denies any numbness, tingling, or changes in hearing. She has taken various otc pain killers, ibuprofen , excedrin, tylenol , but has not taken anything today.      Medications: Outpatient Medications Prior to Visit  Medication Sig   acetaminophen  (TYLENOL ) 500 MG tablet Take 2 tablets (1,000 mg total) by mouth 2 (two) times daily.   albuterol  (VENTOLIN  HFA) 108 (90 Base) MCG/ACT inhaler Inhale 2 puffs into the lungs every 6 (six) hours as needed for wheezing or shortness of breath.   aspirin  EC 81 MG tablet Take 1 tablet (81 mg total) by mouth daily.   hydrochlorothiazide  (HYDRODIURIL ) 12.5 MG tablet Take 1 daily as needed for leg swelling- use just when needed   medroxyPROGESTERone  Acetate 150 MG/ML SUSY Inject 1 mL (150 mg total) into the muscle every 3 (three) months.   megestrol  (MEGACE ) 40 MG tablet TAKE 2 TABLETS BY MOUTH TWICE DAILY IN THE EVENT OF HEAVY BLEEDING   No facility-administered medications prior to visit.    Review of Systems  Constitutional:  Negative for fatigue and fever.  Respiratory:  Negative for cough  and shortness of breath.   Cardiovascular:  Negative for chest pain and leg swelling.  Gastrointestinal:  Negative for abdominal pain.  Neurological:  Positive for headaches. Negative for dizziness.       Objective    BP 120/85   Pulse 67   Ht 5\' 4"  (1.626 m)   Wt 174 lb 6.4 oz (79.1 kg)   LMP  (LMP Unknown)   BMI 29.94 kg/m    Physical Exam Constitutional:      General: She is awake.     Appearance: She is well-developed.  HENT:     Head: Normocephalic.  Eyes:     Extraocular Movements: Extraocular movements intact.     Conjunctiva/sclera: Conjunctivae normal.     Comments: PERL, some left sided light sensitivity  Cardiovascular:     Rate and Rhythm: Normal rate and regular rhythm.     Heart sounds: Normal heart sounds.  Pulmonary:     Effort: Pulmonary effort is normal.     Breath sounds: Normal breath sounds.  Skin:    General: Skin is warm.  Neurological:     Mental Status: She is alert and oriented to person, place, and time. Mental status is at baseline.     Cranial Nerves: No cranial nerve deficit or facial asymmetry.     Sensory: Sensation is intact.     Motor: Motor function is intact. No weakness.     Gait: Gait is intact.  Psychiatric:  Attention and Perception: Attention normal.        Mood and Affect: Mood normal.        Speech: Speech normal.        Behavior: Behavior is cooperative.     No results found for any visits on 09/06/23.  Assessment & Plan    Acute intractable headache, unspecified headache type  Acute stress headache persisting since last Thursday, likely triggered by stress related to daughter's medical condition.  Advised to monitor for red flag symptoms such as persistent vision changes, numbness, tingling, weakness, or uncontrollable vomiting, which would necessitate an ER visit. Pt expresses understanding - Administer Toradol  injection, 60 mg. Pt tolerated well  - Advise taking Benadryl  at home to aid headache relief   -  Encourage hydration with water.   - Order blood work to rule out other underlying causes. - Advise using heat or ice on the head for comfort.  - Instruct to contact the office if headache persists until tomorrow.   -     CBC with Differential/Platelet -     Comprehensive metabolic panel with GFR -     Hemoglobin A1c -     Vitamin B12 -     TSH -     Ketorolac  Tromethamine   Return if symptoms worsen or fail to improve.       Trenton Frock, PA-C  [facility] at [facility] [phone] (phone) [phone] (fax)  [facility]

## 2023-09-08 ENCOUNTER — Encounter: Payer: Self-pay | Admitting: Physician Assistant

## 2023-09-28 IMAGING — MR MR PELVIS WO/W CM
13 of 20 series · 34 of 48 positions shown · IV contrast (GADAVIST)
Comparison: Pelvic sonogram 10/23/2020

CLINICAL DATA: Heavy bleeding.

EXAM:
MRI PELVIS WITHOUT AND WITH CONTRAST
TECHNIQUE: Multiplanar multisequence MR imaging of the pelvis was performed
both before and after administration of intravenous contrast.
CONTRAST:  8mL GADAVIST GADOBUTROL 1 MMOL/ML IV SOLN

[Series 3: T2 · coronal · 4.0mm · 1.17mm/px · 3 of 42 slices shown (1 of 5)]
[im 1/42]
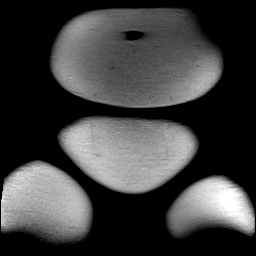
[im 21/42]
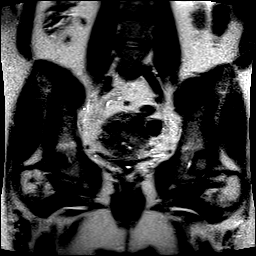
[im 42/42]
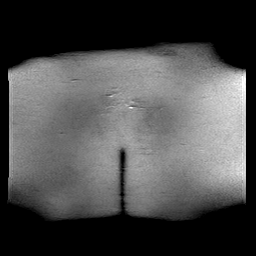

[Series 4: T2 · sagittal · 4.0mm · 0.62mm/px · 2 of 28 slices shown (2 of 5)]
[im 1/28]
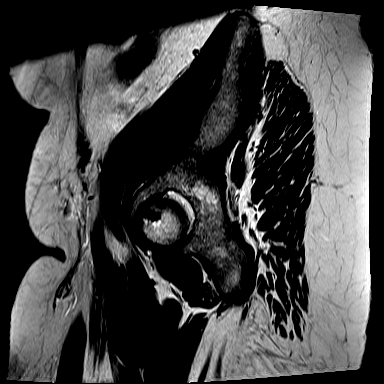
[im 28/28]
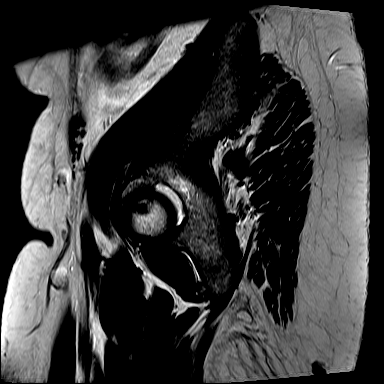

[Series 5: T2 · axial · 4.0mm · 0.57mm/px · z∈[-162,+58]mm · 3 of 45 slices shown (3 of 5)]
[im 1/45]
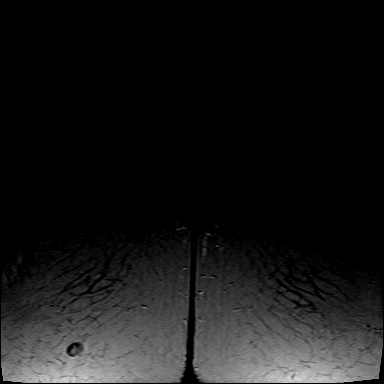
[im 23/45]
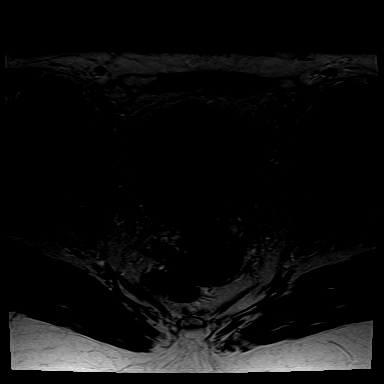
[im 45/45]
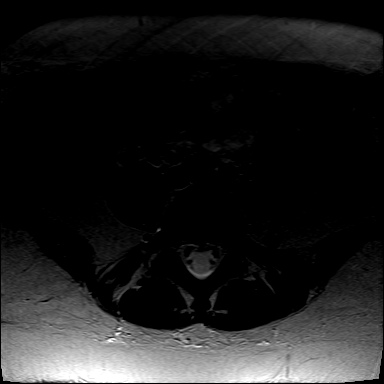

[Series 6: T2 fat-sat · axial · 4.0mm · 0.69mm/px · z∈[-162,+58]mm · 3 of 45 slices shown]
[im 1/45]
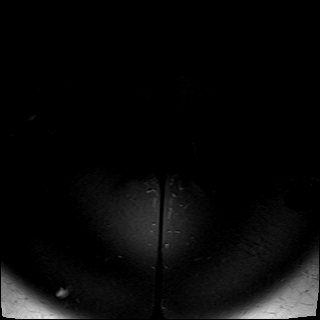
[im 23/45]
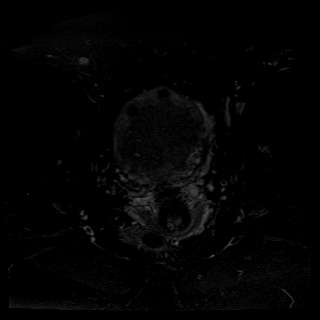
[im 45/45]
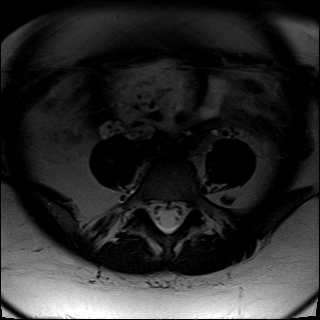

[Series 7: T2 · coronal · 4.0mm · 0.57mm/px · 2 of 36 slices shown (4 of 5)]
[im 1/36]
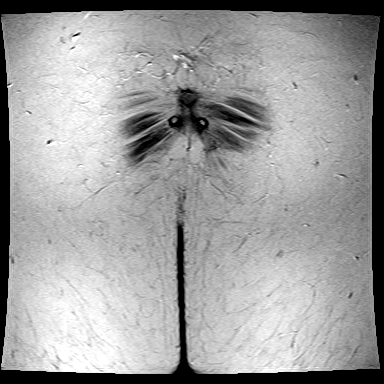
[im 36/36]
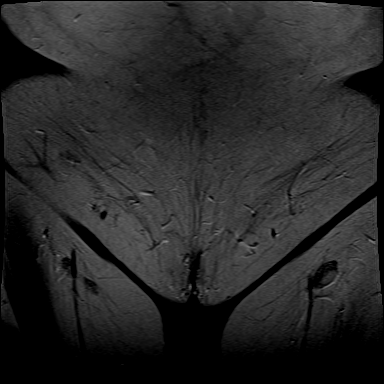

[Series 8: [id number]_800 free breathing · axial · 5.0mm · 1.97mm/px · z∈[-170,+40]mm · 5 of 108 slices shown]
[im 1/108]
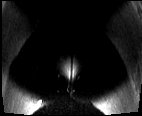
[im 27/108]
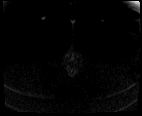
[im 54/108]
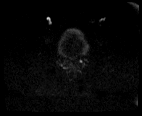
[im 81/108]
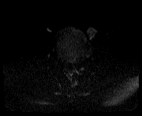
[im 108/108]
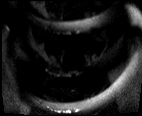

[Series 9: [id number]_800 free breathing_adc · axial · 5.0mm · 1.97mm/px · z∈[-170,+40]mm · 2 of 36 slices shown]
[im 1/36]
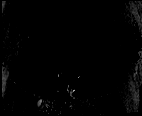
[im 36/36]
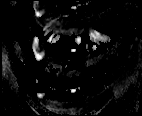

[Series 10: in + out · axial · 5.0mm · 0.55mm/px · z∈[-170,+40]mm · 4 of 72 slices shown]
[im 1/72]
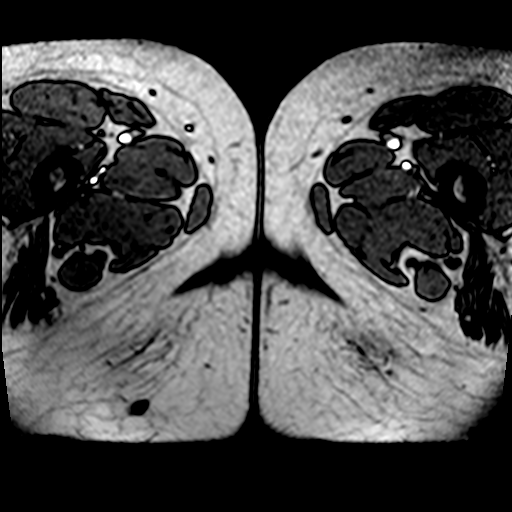
[im 24/72]
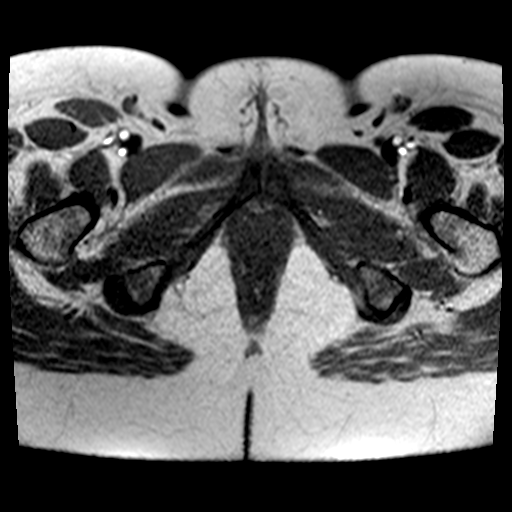
[im 48/72]
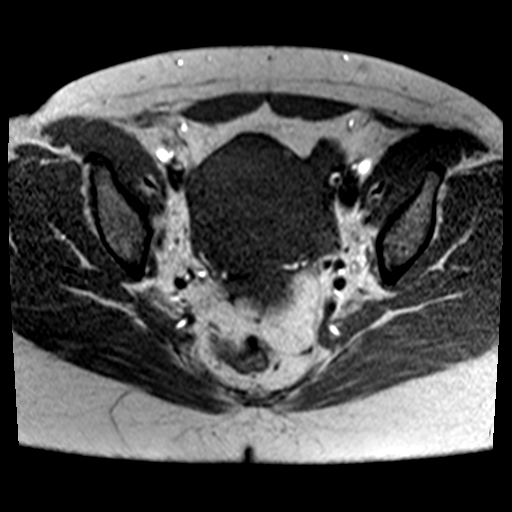
[im 72/72]
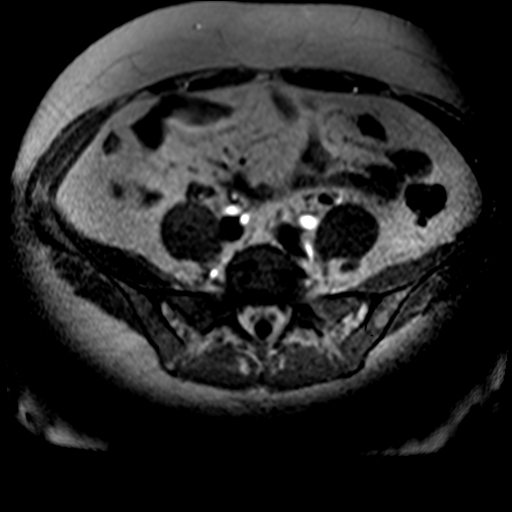

[Series 11: DWI · axial · 5.0mm · 0.59mm/px · z∈[-170,+40]mm · 2 of 36 slices shown]
[im 1/36]
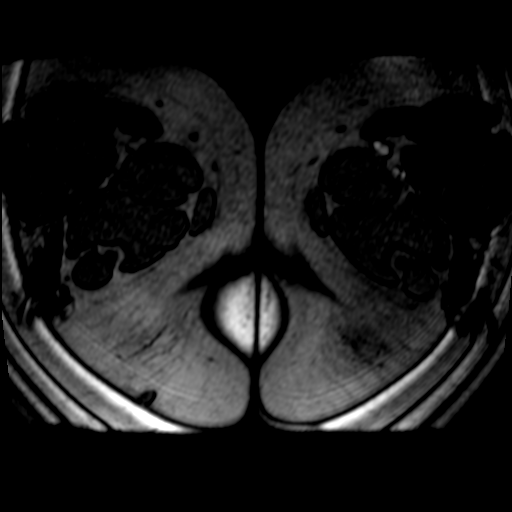
[im 36/36]
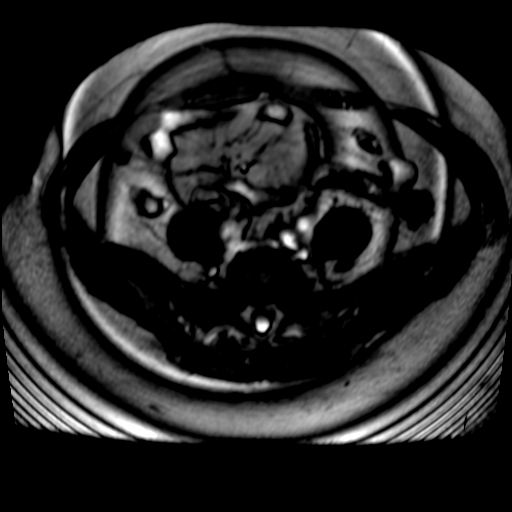

[Series 19: T1 dynamic fat-sat · axial · 5.0mm · 1.11mm/px · z∈[-155,+100]mm · 3 of 52 slices shown (1 of 2)]
[im 1/52]
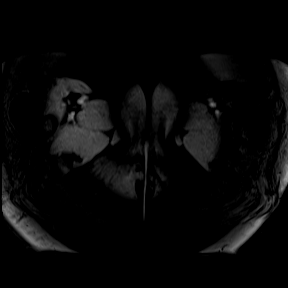
[im 26/52]
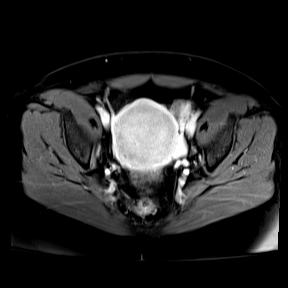
[im 52/52]
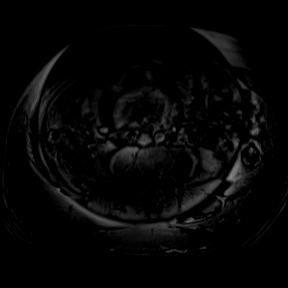

[Series 20: T1 dynamic fat-sat · sagittal · 4.0mm · 0.83mm/px · 2 of 48 slices shown (2 of 2)]
[im 1/48]
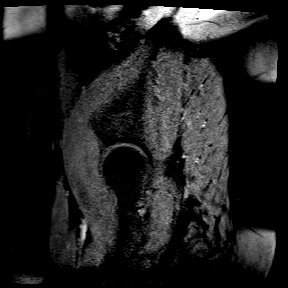
[im 48/48]
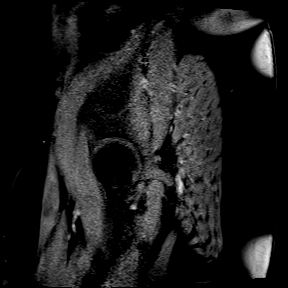

[Series 21: T2 · sagittal · 4.0mm · 0.62mm/px · 1 of 28 slices shown (5 of 5)]
[im 1/28]
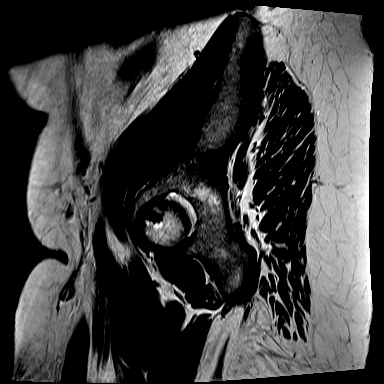

[Series 101: adc_s8_1 · axial · 5.0mm · 1.97mm/px · z∈[-170,+40]mm · 2 of 36 slices shown]
[im 1/36]
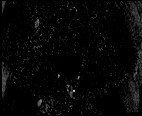
[im 36/36]
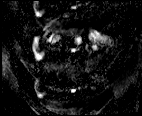

[34 of 48 positions shown; findings below may reference images not displayed]

FINDINGS: Urinary Tract:  No abnormality visualized.

Bowel: No bowel wall thickening, inflammation, or distension.
Sigmoid diverticulosis.

Vascular/Lymphatic: No pathologically enlarged lymph nodes. No
significant vascular abnormality seen.

Reproductive:

Uterus: Measures 7.2 x 7.2 by 7.3 cm (volume = 200 cm^3).

There is a large, dominant fibroid which replaces most of the normal
uterus measuring 7.0 x 6.5 by 6.6 cm. This demonstrates diffuse,
relative homogeneous enhancement on the postcontrast images without
out signs of internal cystic degeneration. This appears to
completely encase the endometrium with significant submucosal
component.

Additional smaller scattered enhancing intramural and subserosal
fibroids scattered throughout the uterus. The largest of these is
subserosal in location arising off the left lateral wall of the
uterus measuring 2.2 x 1.4 by 1.9 cm, image [DATE] and image [DATE].

The endometrium appears significantly compromised. This is best seen
on image 16 of series 4 measuring 3 mm.

Right ovary: Measures 2.4 x 1.6 by 2.2 cm (volume = 4.4 cm^3).
Normal in appearance. No adnexal mass.

Left ovary: Measures 1.9 x 1.4 by 2.5 cm (volume = 3.5 cm^3).
Normal in appearance. No adnexal mass.

Other:  There is no free fluid or fluid collections identified.

Musculoskeletal: No suspicious bone lesions identified.
IMPRESSION: 1. Large, dominant fibroid replaces most of the normal uterus. This
demonstrates diffuse, homogeneous enhancement on the postcontrast
images without out signs of internal cystic degeneration. This
appears to completely encase the endometrium with significant
submucosal component.
2. Additional smaller, enhancing intramural and subserosal fibroids
scattered throughout the uterus. The largest of these is subserosal
in location arising off the left lateral wall of the uterus
measuring 2.2 x 1.4 by 1.9 cm.
3. Sigmoid diverticulosis.

## 2023-11-30 ENCOUNTER — Other Ambulatory Visit: Payer: Self-pay | Admitting: Family Medicine

## 2023-12-01 ENCOUNTER — Ambulatory Visit

## 2023-12-01 ENCOUNTER — Other Ambulatory Visit (HOSPITAL_BASED_OUTPATIENT_CLINIC_OR_DEPARTMENT_OTHER): Payer: Self-pay

## 2023-12-01 ENCOUNTER — Other Ambulatory Visit: Payer: Self-pay

## 2023-12-01 VITALS — BP 121/87 | HR 85 | Ht 64.0 in | Wt 176.0 lb

## 2023-12-01 DIAGNOSIS — Z3042 Encounter for surveillance of injectable contraceptive: Secondary | ICD-10-CM | POA: Diagnosis not present

## 2023-12-01 MED ORDER — MEDROXYPROGESTERONE ACETATE 150 MG/ML IM SUSY
150.0000 mg | PREFILLED_SYRINGE | INTRAMUSCULAR | 3 refills | Status: DC
  Filled 2023-12-01: qty 1, 90d supply, fill #0

## 2023-12-01 MED ORDER — MEDROXYPROGESTERONE ACETATE 150 MG/ML IM SUSP
150.0000 mg | Freq: Once | INTRAMUSCULAR | Status: AC
  Administered 2023-12-01: 150 mg via INTRAMUSCULAR

## 2023-12-01 MED ORDER — MEDROXYPROGESTERONE ACETATE 150 MG/ML IM SUSY: 150.0000 mg | PREFILLED_SYRINGE | INTRAMUSCULAR | 3 refills | Status: DC

## 2023-12-12 ENCOUNTER — Emergency Department (HOSPITAL_BASED_OUTPATIENT_CLINIC_OR_DEPARTMENT_OTHER)

## 2023-12-12 ENCOUNTER — Encounter (HOSPITAL_BASED_OUTPATIENT_CLINIC_OR_DEPARTMENT_OTHER): Payer: Self-pay

## 2023-12-12 ENCOUNTER — Telehealth: Payer: Self-pay

## 2023-12-12 ENCOUNTER — Emergency Department (HOSPITAL_BASED_OUTPATIENT_CLINIC_OR_DEPARTMENT_OTHER): Admission: EM | Admit: 2023-12-12 | Discharge: 2023-12-12 | Disposition: A | Discharge status: Home / Self Care

## 2023-12-12 ENCOUNTER — Ambulatory Visit (INDEPENDENT_AMBULATORY_CARE_PROVIDER_SITE_OTHER): Admitting: Physician Assistant

## 2023-12-12 VITALS — BP 124/84 | HR 102 | Ht 64.0 in | Wt 176.4 lb

## 2023-12-12 DIAGNOSIS — R2242 Localized swelling, mass and lump, left lower limb: Secondary | ICD-10-CM | POA: Diagnosis present

## 2023-12-12 DIAGNOSIS — Z7901 Long term (current) use of anticoagulants: Secondary | ICD-10-CM | POA: Diagnosis not present

## 2023-12-12 DIAGNOSIS — R600 Localized edema: Secondary | ICD-10-CM | POA: Diagnosis not present

## 2023-12-12 DIAGNOSIS — Z86718 Personal history of other venous thrombosis and embolism: Secondary | ICD-10-CM | POA: Diagnosis not present

## 2023-12-12 DIAGNOSIS — Z7982 Long term (current) use of aspirin: Secondary | ICD-10-CM | POA: Insufficient documentation

## 2023-12-12 DIAGNOSIS — M7989 Other specified soft tissue disorders: Secondary | ICD-10-CM

## 2023-12-12 LAB — APTT: aPTT: 31 s (ref 24–36)

## 2023-12-12 LAB — COMPREHENSIVE METABOLIC PANEL WITH GFR
ALT: 10 U/L (ref 0–44)
AST: 16 U/L (ref 15–41)
Albumin: 4.2 g/dL (ref 3.5–5.0)
Alkaline Phosphatase: 72 U/L (ref 38–126)
Anion gap: 12 (ref 5–15)
BUN: 7 mg/dL (ref 6–20)
CO2: 24 mmol/L (ref 22–32)
Calcium: 9.2 mg/dL (ref 8.9–10.3)
Chloride: 106 mmol/L (ref 98–111)
Creatinine, Ser: 0.87 mg/dL (ref 0.44–1.00)
GFR, Estimated: >60 mL/min (ref >60)
Glucose, Bld: 80 mg/dL (ref 70–99)
Potassium: 3.2 mmol/L — ABNORMAL LOW (ref 3.5–5.1)
Sodium: 141 mmol/L (ref 135–145)
Total Bilirubin: 0.4 mg/dL (ref 0.0–1.2)
Total Protein: 7.2 g/dL (ref 6.5–8.1)

## 2023-12-12 LAB — CBC WITH DIFFERENTIAL/PLATELET
Abs Immature Granulocytes: 0.02 K/uL (ref 0.00–0.07)
Basophils Absolute: 0 K/uL (ref 0.0–0.1)
Basophils Relative: 0 %
Eosinophils Absolute: 0.1 K/uL (ref 0.0–0.5)
Eosinophils Relative: 1 %
HCT: 42.3 % (ref 36.0–46.0)
Hemoglobin: 13.6 g/dL (ref 12.0–15.0)
Immature Granulocytes: 0 %
Lymphocytes Relative: 46 %
Lymphs Abs: 3 K/uL (ref 0.7–4.0)
MCH: 23.6 pg — ABNORMAL LOW (ref 26.0–34.0)
MCHC: 32.2 g/dL (ref 30.0–36.0)
MCV: 73.3 fL — ABNORMAL LOW (ref 80.0–100.0)
Monocytes Absolute: 0.5 K/uL (ref 0.1–1.0)
Monocytes Relative: 8 %
Neutro Abs: 3 K/uL (ref 1.7–7.7)
Neutrophils Relative %: 45 %
Platelets: 296 K/uL (ref 150–400)
RBC: 5.77 MIL/uL — ABNORMAL HIGH (ref 3.87–5.11)
RDW: 14.5 % (ref 11.5–15.5)
WBC: 6.6 K/uL (ref 4.0–10.5)
nRBC: 0 % (ref 0.0–0.2)

## 2023-12-12 LAB — PROTIME-INR
INR: 1 (ref 0.8–1.2)
Prothrombin Time: 13.6 s (ref 11.4–15.2)

## 2023-12-12 MED ORDER — POTASSIUM CHLORIDE CRYS ER 20 MEQ PO TBCR
20.0000 meq | EXTENDED_RELEASE_TABLET | Freq: Once | ORAL | Status: AC
  Administered 2023-12-12: 20 meq via ORAL
  Filled 2023-12-12: qty 1

## 2023-12-12 NOTE — ED Provider Notes (Signed)
 [facility] [place] Provider Note   CSN: [id number] Arrival date & time: 12/12/23  1028     Patient presents with: No chief complaint on file.   KAILEEN BRONKEMA is a 48 y.o. female.   HPI   Patient presents due to swelling and pain of the left lower extremity.  Patient states that she is on her feet for 12 hours at a time.  Works long days.  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  She was on Eliquis  but developed a rash to this.  Subsequent switch to warfarin.  Now is on baby aspirin  daily.  Endorses some generalized pain in his left leg.  No numbness or tingling.  Had a meniscus surgery in the right knee this past spring.  No chest pain or shortness of breath.  No pleuritic chest pain.  No hemoptysis.  No history of PE.    Prior to Admission medications   Medication Sig Start Date End Date Taking? Authorizing Provider  acetaminophen  (TYLENOL ) 500 MG tablet Take 2 tablets (1,000 mg total) by mouth 2 (two) times daily. 07/11/18   Monetta Redell PARAS, MD  albuterol  (VENTOLIN  HFA) 108 (90 Base) MCG/ACT inhaler Inhale 2 puffs into the lungs every 6 (six) hours as needed for wheezing or shortness of breath. 05/29/19   Saguier, [place], PA-C  aspirin  EC 81 MG tablet Take 1 tablet (81 mg total) by mouth daily. 07/11/18   Monetta Redell PARAS, MD  hydrochlorothiazide  (HYDRODIURIL ) 12.5 MG tablet Take 1 daily as needed for leg swelling- use just when needed 01/31/23   Drubel, Manuelita, PA-C  medroxyPROGESTERone  Acetate 150 MG/ML SUSY Inject 1 mL (150 mg total) into the muscle every 3 (three) months. 12/01/23   Abigail Rollo DASEN, MD  megestrol  (MEGACE ) 40 MG tablet TAKE 2 TABLETS BY MOUTH TWICE DAILY IN THE EVENT OF HEAVY BLEEDING 02/10/21   Stinson, Jacob J, DO    Allergies: Iodinated contrast media and Rivaroxaban     Review of Systems  Constitutional:  Negative for chills and fever.   HENT:  Negative for ear pain and sore throat.   Eyes:  Negative for pain and visual disturbance.  Respiratory:  Negative for cough and shortness of breath.   Cardiovascular:  Negative for chest pain and palpitations.  Gastrointestinal:  Negative for abdominal pain and vomiting.  Genitourinary:  Negative for dysuria and hematuria.  Musculoskeletal:  Negative for arthralgias and back pain.  Skin:  Negative for color change and rash.  Neurological:  Negative for seizures and syncope.  All other systems reviewed and are negative.   Updated Vital Signs BP (!) 127/91   Pulse 78   Temp 97.8 F (36.6 C) (Oral)   Resp 17   LMP 11/22/2023 (Exact Date)   SpO2 100%   Physical Exam Vitals and nursing note reviewed.  Constitutional:      General: She is not in acute distress.    Appearance: She is well-developed.  HENT:     Head: Normocephalic and atraumatic.  Eyes:     Conjunctiva/sclera: Conjunctivae normal.  Cardiovascular:     Rate and Rhythm: Normal rate and regular rhythm.     Heart sounds: No murmur heard. Pulmonary:     Effort: Pulmonary effort is normal. No respiratory distress.     Breath sounds: Normal breath sounds.  Abdominal:     Palpations: Abdomen is soft.  Tenderness: There is no abdominal tenderness.  Musculoskeletal:        General: No swelling.     Cervical back: Neck supple.     Comments: Mild swelling LLE compared to right  2+ dorsal pedal/posterior tibial pulses bilaterally   Skin:    General: Skin is warm and dry.     Capillary Refill: Capillary refill takes less than 2 seconds.  Neurological:     Mental Status: She is alert.  Psychiatric:        Mood and Affect: Mood normal.     (all labs ordered are listed, but only abnormal results are displayed) Labs Reviewed  COMPREHENSIVE METABOLIC PANEL WITH GFR - Abnormal; Notable for the following components:      Result Value   Potassium 3.2 (*)    All other components within normal limits  CBC  WITH DIFFERENTIAL/PLATELET - Abnormal; Notable for the following components:   RBC 5.77 (*)    MCV 73.3 (*)    MCH 23.6 (*)    All other components within normal limits  PROTIME-INR  APTT    EKG: None  Radiology: US  Venous Img Lower Unilateral Left Result Date: 12/12/2023 CLINICAL DATA:  Swelling, pain EXAM: LEFT LOWER EXTREMITY VENOUS DOPPLER ULTRASOUND TECHNIQUE: Gray-scale sonography with compression, as well as color and duplex ultrasound, were performed to evaluate the deep venous system(s) from the level of the common femoral vein through the popliteal and proximal calf veins. COMPARISON:  None Available. FINDINGS: VENOUS Normal compressibility of the common femoral, superficial femoral, and popliteal veins, as well as the visualized calf veins. Visualized portions of profunda femoral vein and great saphenous vein unremarkable. No filling defects to suggest DVT on grayscale or color Doppler imaging. Doppler waveforms show normal direction of venous flow, normal respiratory plasticity and response to augmentation. Limited views of the contralateral common femoral vein are unremarkable. OTHER A fluid collection is present adjacent to the medial aspect of the left knee which is favored to be anechoic and measures approximately 3.5 cm in largest dimension. Mildly prominent inguinal lymph nodes are also present. Edema is also noted in the lower extremity. Limitations: none IMPRESSION: 1. No evidence of left lower extremity DVT. Electronically Signed   By: Maude Naegeli M.D.   On: 12/12/2023 12:19     Procedures   Medications Ordered in the ED  potassium chloride  SA (KLOR-CON  M) CR tablet 20 mEq (20 mEq Oral Given 12/12/23 1310)                                    Medical Decision Making Amount and/or Complexity of Data Reviewed Labs: ordered.  Risk Prescription drug management.   Patient presents due to swelling and pain of the left lower extremity.  Patient states that she is on her  feet for 12 hours at a time.  Works long days.  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  She was on Eliquis  but developed a rash to this.  Subsequent switch to warfarin.  Now is on baby aspirin  daily.  Endorses some generalized pain in his left leg.  No numbness or tingling.  Had a meniscus surgery in the right knee this past spring.  No chest pain or shortness of breath.  No pleuritic chest pain.  No hemoptysis.  No history of PE.  Upon exam, patient hemodynamically stable.   No concerns for PE.  No tachypnea or tachycardia.  No pleuritic chest pain or hemoptysis.  O2 saturation 99% on room air.   Left lower leg did show a minimal amount of swelling compared to the right.  2+ dorsal pedal and posterior tibial pulses bilaterally.  No concerns for Ischemic disease.  Obtain DVT ultrasound.  Unremarkable.  No DVT was seen.  Was unremarkable.  Could be venous insufficiency.  Recommended that if patient has continued swelling as well as pain that she may need repeat ultrasound in 1 to 2 weeks to be definitively rule out any, evidence of DVT given her risk factors as well as swelling to that side.         Final diagnoses:  Left leg swelling    ED Discharge Orders     None          Simon Lavonia SAILOR, MD 12/12/23 1455

## 2023-12-12 NOTE — Telephone Encounter (Signed)
 Initial Comment Caller states her left leg is swollen and unable to bend and behind the knee is very tender and painful. Caller was transferred from the office to triage. Translation No Nurse Assessment Nurse: Manda, RN, Olena Date/Time (Eastern Time): 12/12/2023 9:09:22 AM Confirm and document reason for call. If symptomatic, describe symptoms. ---Caller states her left leg is swollen and painful behind the knee. pain level 8. Does the patient have any new or worsening symptoms? ---Yes Will a triage be completed? ---Yes Related visit to physician within the last 2 weeks? ---No Does the PT have any chronic conditions? (i.e. diabetes, asthma, this includes High risk factors for pregnancy, etc.) ---No Is the patient pregnant or possibly pregnant? (Ask all females between the ages of 55-55) ---No Is this a behavioral health or substance abuse call? ---No Guidelines Guideline Title Affirmed Question Affirmed Notes Nurse Date/Time Titus Time) Leg Swelling and Edema [1] Thigh, calf, or ankle swelling AND [2] only 1 side Manda, Charity fundraiser, Vera 12/12/2023 9:11:41 AM Disp. Time Titus Time) Disposition Final User 12/12/2023 9:14:33 AM See HCP (or PCP Triage) Within 4 Hours Yes Manda, RN, Olena PLEASE NOTE: All timestamps contained within this report are represented as [place] Standard Time. CONFIDENTIALTY NOTICE: This fax transmission is intended only for the addressee. It contains information that is legally privileged, confidential or otherwise protected from use or disclosure. If you are not the intended recipient, you are strictly prohibited from reviewing, disclosing, copying using or disseminating any of this information or taking any action in reliance on or regarding this information. If you have received this fax in error, please notify us  immediately by telephone so that we can arrange for its return to us . Phone: [phone], Toll-Free: [phone], Fax:  [phone] Shene Maxfield 05/04/1976 Page: 1 of2 CallId: [id number] Final Disposition 12/12/2023 9:14:33 AM See HCP (or PCP Triage) Within 4 Hours Yes Manda, RN, Olena [place] Disagree/Comply Comply Caller Understands Yes PreDisposition Call Doctor Care Advice Given Per Guideline SEE HCP (OR PCP TRIAGE) WITHIN 4 HOURS: * IF OFFICE WILL BE OPEN: You need to be seen within the next 3 or 4 hours. Call your doctor (or NP/PA) now or as soon as the office opens. CALL EMS 911 IF: * Chest pain or shortness of breath occurs CALL BACK IF: * You become worse CARE ADVICE given per Leg Swelling and Edema (Adult) guideline. Comments User: Olena Manda, RN Date/Time Titus Time): 12/12/2023 9:14:11 AM pt states she has appt at 10:20 am today Referrals REFERRED TO PCP OFFICE

## 2023-12-12 NOTE — Progress Notes (Signed)
 Established patient visit   Patient: Angela Shah   DOB: 1975-10-18   48 y.o. Female  MRN: [id number] Visit Date: 12/12/2023  Today's healthcare provider: Manuelita Flatness, PA-C   Cc. Left leg pain and swelling  Subjective     Pt reports significant left leg pain and swelling starting yesterday, only worsening. Cannot bend leg without pain. Denies recent long travel, immobility. Right knee surgery in march. History of DVT on asa.  Medications: Outpatient Medications Prior to Visit  Medication Sig   acetaminophen  (TYLENOL ) 500 MG tablet Take 2 tablets (1,000 mg total) by mouth 2 (two) times daily.   albuterol  (VENTOLIN  HFA) 108 (90 Base) MCG/ACT inhaler Inhale 2 puffs into the lungs every 6 (six) hours as needed for wheezing or shortness of breath.   aspirin  EC 81 MG tablet Take 1 tablet (81 mg total) by mouth daily.   hydrochlorothiazide  (HYDRODIURIL ) 12.5 MG tablet Take 1 daily as needed for leg swelling- use just when needed   medroxyPROGESTERone  Acetate 150 MG/ML SUSY Inject 1 mL (150 mg total) into the muscle every 3 (three) months.   megestrol  (MEGACE ) 40 MG tablet TAKE 2 TABLETS BY MOUTH TWICE DAILY IN THE EVENT OF HEAVY BLEEDING   No facility-administered medications prior to visit.    Review of Systems  Constitutional:  Negative for fatigue and fever.  Respiratory:  Negative for cough and shortness of breath.   Cardiovascular:  Positive for leg swelling. Negative for chest pain.  Gastrointestinal:  Negative for abdominal pain.  Musculoskeletal:  Positive for myalgias.  Neurological:  Negative for dizziness and headaches.       Objective    BP 124/84   Pulse (!) 102   Ht 5' 4 (1.626 m)   Wt 176 lb 6.4 oz (80 kg)   LMP 11/22/2023 (Exact Date)   BMI 30.28 kg/m    Physical Exam Constitutional:      General: She is awake.     Appearance: She is well-developed.  HENT:     Head: Normocephalic.  Eyes:     Conjunctiva/sclera: Conjunctivae normal.   Cardiovascular:     Rate and Rhythm: Normal rate and regular rhythm.     Heart sounds: Normal heart sounds.  Pulmonary:     Effort: Pulmonary effort is normal.     Breath sounds: Normal breath sounds.  Musculoskeletal:     Comments: Left leg edema from knee to foot without pitting  Warmth, pain with pressure to calf, + homan's sign on L  Skin:    General: Skin is warm.  Neurological:     Mental Status: She is alert and oriented to person, place, and time.  Psychiatric:        Attention and Perception: Attention normal.        Mood and Affect: Mood normal.        Speech: Speech normal.        Behavior: Behavior is cooperative.     No results found for any visits on 12/12/23.  Assessment & Plan    Edema of left lower extremity  History of DVT (deep vein thrombosis)  Pt has h/o of unprovoked DVT only on asa daily  sus of dvt LLE .  Advised to go to ED for stat doppler to r/o. Pt has allergy to Xarelto , last dvt managed with coumadin  Pt agreeable with plan, she is brought down to ED in an wheelchair by MA.  Manuelita Flatness, PA-C  [place]  [facility] [phone] (phone) [phone] (fax)  [facility]

## 2023-12-12 NOTE — Discharge Instructions (Signed)
 There is no evidence of any blood clot in your leg.   Continue to have worsening swelling or pain in the next week or 2, we may want to repeat this given your history.   If You have any kind of chest pain or shortness of breath then please come to the ED for further evaluation.

## 2023-12-12 NOTE — ED Triage Notes (Signed)
 Pt presents with complaints left left leg pain & swelling that started yesterday. Sent here by PCP for evaluation to rule out blood clot. Denies SOB, recent surgeries or long travel.  Hx of blood clots

## 2024-02-23 ENCOUNTER — Other Ambulatory Visit (HOSPITAL_BASED_OUTPATIENT_CLINIC_OR_DEPARTMENT_OTHER): Payer: Self-pay

## 2024-02-23 ENCOUNTER — Other Ambulatory Visit: Payer: Self-pay

## 2024-02-23 ENCOUNTER — Ambulatory Visit

## 2024-02-23 VITALS — BP 130/76 | HR 72 | Wt 172.0 lb

## 2024-02-23 DIAGNOSIS — Z3042 Encounter for surveillance of injectable contraceptive: Secondary | ICD-10-CM | POA: Diagnosis not present

## 2024-02-23 MED ORDER — MEDROXYPROGESTERONE ACETATE 150 MG/ML IM SUSY
150.0000 mg | PREFILLED_SYRINGE | INTRAMUSCULAR | 3 refills | Status: AC
  Filled 2024-02-23: qty 1, 90d supply, fill #0
  Filled 2024-05-09 – 2024-05-10 (×2): qty 1, 90d supply, fill #1

## 2024-02-23 MED ORDER — MEDROXYPROGESTERONE ACETATE 150 MG/ML IM SUSP
150.0000 mg | Freq: Once | INTRAMUSCULAR | Status: AC
  Administered 2024-02-23: 150 mg via INTRAMUSCULAR

## 2024-02-23 NOTE — Progress Notes (Signed)
 Date last pap: 11/05/21. Last Depo-Provera : 08/18/23. Side Effects if any: N/A. Serum HCG indicated? N/A Tubal Ligation.(See scanned document 05/07/2018) Depo-Provera  150 mg IM given by: York Blush, CMA. Next appointment due 05/10/24-05/24/24.   Nylia Gavina l Cambryn Charters, CMA

## 2024-03-29 ENCOUNTER — Ambulatory Visit

## 2024-05-10 ENCOUNTER — Ambulatory Visit

## 2024-05-10 ENCOUNTER — Ambulatory Visit: Admitting: Obstetrics and Gynecology

## 2024-05-10 ENCOUNTER — Other Ambulatory Visit (HOSPITAL_BASED_OUTPATIENT_CLINIC_OR_DEPARTMENT_OTHER): Payer: Self-pay

## 2024-05-10 ENCOUNTER — Other Ambulatory Visit: Payer: Self-pay

## 2024-05-10 VITALS — BP 112/77 | HR 92 | Ht 64.0 in | Wt 170.0 lb

## 2024-05-10 DIAGNOSIS — Z3042 Encounter for surveillance of injectable contraceptive: Secondary | ICD-10-CM | POA: Diagnosis not present

## 2024-05-10 MED ORDER — MEDROXYPROGESTERONE ACETATE 150 MG/ML IM SUSP
150.0000 mg | Freq: Once | INTRAMUSCULAR | Status: AC
  Administered 2024-05-10: 150 mg via INTRAMUSCULAR

## 2024-05-10 NOTE — Progress Notes (Signed)
 Date last pap: 11/05/2021. Last Depo-Provera : 02/23/2024. Side Effects if any: none reported. UPT indicated? no. Depo-Provera  150 mg IM given by: Dorothe Alberts, student CMA in [employer]. Next appointment due 3 months.   Silvano LELON Piano, RN

## 2024-06-03 ENCOUNTER — Ambulatory Visit: Admitting: Obstetrics & Gynecology

## 2024-08-02 ENCOUNTER — Ambulatory Visit
